# Patient Record
Sex: Female | Born: 1970 | Race: White | Hispanic: No | Marital: Married | Smoking: Former smoker
Health system: Southern US, Community
[De-identification: ages and names within clinical notes are randomized; demographics above are authoritative.]

## PROBLEM LIST (undated history)

## (undated) DIAGNOSIS — D689 Coagulation defect, unspecified: Secondary | ICD-10-CM

## (undated) DIAGNOSIS — N83209 Unspecified ovarian cyst, unspecified side: Secondary | ICD-10-CM

## (undated) DIAGNOSIS — E119 Type 2 diabetes mellitus without complications: Secondary | ICD-10-CM

## (undated) DIAGNOSIS — F32A Depression, unspecified: Secondary | ICD-10-CM

## (undated) DIAGNOSIS — F329 Major depressive disorder, single episode, unspecified: Secondary | ICD-10-CM

## (undated) DIAGNOSIS — Z9049 Acquired absence of other specified parts of digestive tract: Secondary | ICD-10-CM

## (undated) DIAGNOSIS — D759 Disease of blood and blood-forming organs, unspecified: Secondary | ICD-10-CM

## (undated) DIAGNOSIS — F419 Anxiety disorder, unspecified: Secondary | ICD-10-CM

## (undated) HISTORY — PX: OTHER SURGICAL HISTORY: SHX169

## (undated) HISTORY — PX: TONSILLECTOMY: SUR1361

## (undated) HISTORY — PX: ABDOMINAL HYSTERECTOMY: SHX81

## (undated) HISTORY — PX: CHOLECYSTECTOMY: SHX55

---

## 2006-03-10 ENCOUNTER — Other Ambulatory Visit (HOSPITAL_COMMUNITY): Admission: RE | Admit: 2006-03-10 | Discharge: 2006-06-08 | Payer: Self-pay | Admitting: Psychiatry

## 2006-03-10 ENCOUNTER — Ambulatory Visit: Payer: Self-pay | Admitting: Psychiatry

## 2006-03-25 ENCOUNTER — Ambulatory Visit: Payer: Self-pay | Admitting: Unknown Physician Specialty

## 2006-04-12 ENCOUNTER — Ambulatory Visit: Payer: Self-pay | Admitting: Unknown Physician Specialty

## 2006-05-13 ENCOUNTER — Ambulatory Visit: Payer: Self-pay | Admitting: Unknown Physician Specialty

## 2010-07-27 ENCOUNTER — Inpatient Hospital Stay (HOSPITAL_COMMUNITY)
Admission: EM | Admit: 2010-07-27 | Discharge: 2010-08-03 | DRG: 871 | Disposition: A | Discharge status: Home / Self Care | Payer: PRIVATE HEALTH INSURANCE | Attending: Internal Medicine | Admitting: Internal Medicine

## 2010-07-27 ENCOUNTER — Encounter (HOSPITAL_COMMUNITY): Payer: Self-pay | Admitting: Radiology

## 2010-07-27 ENCOUNTER — Emergency Department (HOSPITAL_COMMUNITY): Payer: PRIVATE HEALTH INSURANCE

## 2010-07-27 DIAGNOSIS — R6521 Severe sepsis with septic shock: Secondary | ICD-10-CM | POA: Diagnosis not present

## 2010-07-27 DIAGNOSIS — E872 Acidosis, unspecified: Secondary | ICD-10-CM | POA: Diagnosis present

## 2010-07-27 DIAGNOSIS — R195 Other fecal abnormalities: Secondary | ICD-10-CM | POA: Diagnosis not present

## 2010-07-27 DIAGNOSIS — J9600 Acute respiratory failure, unspecified whether with hypoxia or hypercapnia: Secondary | ICD-10-CM | POA: Diagnosis not present

## 2010-07-27 DIAGNOSIS — R9431 Abnormal electrocardiogram [ECG] [EKG]: Secondary | ICD-10-CM

## 2010-07-27 DIAGNOSIS — K859 Acute pancreatitis without necrosis or infection, unspecified: Secondary | ICD-10-CM | POA: Diagnosis present

## 2010-07-27 DIAGNOSIS — N179 Acute kidney failure, unspecified: Secondary | ICD-10-CM | POA: Diagnosis present

## 2010-07-27 DIAGNOSIS — A419 Sepsis, unspecified organism: Principal | ICD-10-CM | POA: Diagnosis present

## 2010-07-27 DIAGNOSIS — D696 Thrombocytopenia, unspecified: Secondary | ICD-10-CM | POA: Diagnosis present

## 2010-07-27 DIAGNOSIS — A088 Other specified intestinal infections: Secondary | ICD-10-CM | POA: Diagnosis present

## 2010-07-27 DIAGNOSIS — D649 Anemia, unspecified: Secondary | ICD-10-CM | POA: Diagnosis present

## 2010-07-27 DIAGNOSIS — R079 Chest pain, unspecified: Secondary | ICD-10-CM

## 2010-07-27 DIAGNOSIS — D65 Disseminated intravascular coagulation [defibrination syndrome]: Secondary | ICD-10-CM | POA: Diagnosis not present

## 2010-07-27 DIAGNOSIS — K5289 Other specified noninfective gastroenteritis and colitis: Secondary | ICD-10-CM | POA: Diagnosis present

## 2010-07-27 DIAGNOSIS — R6520 Severe sepsis without septic shock: Secondary | ICD-10-CM | POA: Diagnosis present

## 2010-07-27 DIAGNOSIS — F17200 Nicotine dependence, unspecified, uncomplicated: Secondary | ICD-10-CM | POA: Diagnosis present

## 2010-07-27 LAB — COMPREHENSIVE METABOLIC PANEL
Alkaline Phosphatase: 116 U/L (ref 39–117)
BUN: 9 mg/dL (ref 6–23)
Chloride: 106 mEq/L (ref 96–112)
Glucose, Bld: 118 mg/dL — ABNORMAL HIGH (ref 70–99)
Potassium: 3.4 mEq/L — ABNORMAL LOW (ref 3.5–5.1)
Total Bilirubin: 1.3 mg/dL — ABNORMAL HIGH (ref 0.3–1.2)

## 2010-07-27 LAB — POCT I-STAT 3, ART BLOOD GAS (G3+)
Bicarbonate: 17.5 mEq/L — ABNORMAL LOW (ref 20.0–24.0)
Patient temperature: 98.6
pH, Arterial: 7.363 (ref 7.350–7.400)
pO2, Arterial: 69 mmHg — ABNORMAL LOW (ref 80.0–100.0)

## 2010-07-27 LAB — POCT I-STAT, CHEM 8
Glucose, Bld: 115 mg/dL — ABNORMAL HIGH (ref 70–99)
HCT: 51 % — ABNORMAL HIGH (ref 36.0–46.0)
Hemoglobin: 17.3 g/dL — ABNORMAL HIGH (ref 12.0–15.0)
Potassium: 3.2 mEq/L — ABNORMAL LOW (ref 3.5–5.1)
Sodium: 138 mEq/L (ref 135–145)

## 2010-07-27 LAB — CBC
HCT: 45.3 % (ref 36.0–46.0)
MCV: 84.2 fL (ref 78.0–100.0)
RBC: 5.38 MIL/uL — ABNORMAL HIGH (ref 3.87–5.11)
RDW: 12.7 % (ref 11.5–15.5)
WBC: 5.2 10*3/uL (ref 4.0–10.5)

## 2010-07-27 LAB — URINALYSIS, ROUTINE W REFLEX MICROSCOPIC
Hgb urine dipstick: NEGATIVE
Protein, ur: 100 mg/dL — AB
Urobilinogen, UA: 1 mg/dL (ref 0.0–1.0)

## 2010-07-27 LAB — APTT: aPTT: 36 seconds (ref 24–37)

## 2010-07-27 LAB — DIFFERENTIAL
Eosinophils Relative: 1 % (ref 0–5)
Lymphocytes Relative: 8 % — ABNORMAL LOW (ref 12–46)
Lymphs Abs: 0.4 10*3/uL — ABNORMAL LOW (ref 0.7–4.0)
Monocytes Relative: 0 % — ABNORMAL LOW (ref 3–12)

## 2010-07-27 LAB — URINE MICROSCOPIC-ADD ON

## 2010-07-27 LAB — POCT CARDIAC MARKERS: CKMB, poc: 1.1 ng/mL (ref 1.0–8.0)

## 2010-07-27 LAB — LIPASE, BLOOD: Lipase: 31 U/L (ref 11–59)

## 2010-07-27 LAB — LACTIC ACID, PLASMA: Lactic Acid, Venous: 9.1 mmol/L — ABNORMAL HIGH (ref 0.5–2.2)

## 2010-07-27 MED ORDER — IOHEXOL 300 MG/ML  SOLN
80.0000 mL | Freq: Once | INTRAMUSCULAR | Status: AC | PRN
  Administered 2010-07-27: 80 mL via INTRAVENOUS

## 2010-07-28 ENCOUNTER — Inpatient Hospital Stay (HOSPITAL_COMMUNITY): Payer: PRIVATE HEALTH INSURANCE

## 2010-07-28 DIAGNOSIS — D696 Thrombocytopenia, unspecified: Secondary | ICD-10-CM

## 2010-07-28 DIAGNOSIS — A419 Sepsis, unspecified organism: Secondary | ICD-10-CM

## 2010-07-28 DIAGNOSIS — R079 Chest pain, unspecified: Secondary | ICD-10-CM

## 2010-07-28 DIAGNOSIS — N170 Acute kidney failure with tubular necrosis: Secondary | ICD-10-CM

## 2010-07-28 DIAGNOSIS — R6521 Severe sepsis with septic shock: Secondary | ICD-10-CM

## 2010-07-28 DIAGNOSIS — R112 Nausea with vomiting, unspecified: Secondary | ICD-10-CM

## 2010-07-28 DIAGNOSIS — R0789 Other chest pain: Secondary | ICD-10-CM

## 2010-07-28 DIAGNOSIS — R072 Precordial pain: Secondary | ICD-10-CM

## 2010-07-28 DIAGNOSIS — E872 Acidosis: Secondary | ICD-10-CM

## 2010-07-28 DIAGNOSIS — R6520 Severe sepsis without septic shock: Secondary | ICD-10-CM

## 2010-07-28 LAB — CBC
HCT: 34.4 % — ABNORMAL LOW (ref 36.0–46.0)
HCT: 32.4 % — ABNORMAL LOW (ref 36.0–46.0)
Hemoglobin: 13.1 g/dL (ref 12.0–15.0)
Hemoglobin: 12.5 g/dL (ref 12.0–15.0)
Hemoglobin: 11.6 g/dL — ABNORMAL LOW (ref 12.0–15.0)
MCH: 29.3 pg (ref 26.0–34.0)
MCH: 29 pg (ref 26.0–34.0)
MCHC: 36 g/dL (ref 30.0–36.0)
MCV: 81.4 fL (ref 78.0–100.0)
MCV: 81 fL (ref 78.0–100.0)
Platelets: 78 10*3/uL — ABNORMAL LOW (ref 150–400)
RBC: 4.47 MIL/uL (ref 3.87–5.11)
RBC: 4.23 MIL/uL (ref 3.87–5.11)
RBC: 4 MIL/uL (ref 3.87–5.11)

## 2010-07-28 LAB — DRUGS OF ABUSE SCREEN W/O ALC, ROUTINE URINE
Amphetamine Screen, Ur: NEGATIVE
Barbiturate Quant, Ur: NEGATIVE
Marijuana Metabolite: NEGATIVE
Propoxyphene: NEGATIVE

## 2010-07-28 LAB — DIFFERENTIAL
Basophils Relative: 0 % (ref 0–1)
Eosinophils Absolute: 0 10*3/uL (ref 0.0–0.7)
Eosinophils Relative: 0 % (ref 0–5)
Lymphocytes Relative: 3 % — ABNORMAL LOW (ref 12–46)
Monocytes Relative: 3 % (ref 3–12)
Neutrophils Relative %: 94 % — ABNORMAL HIGH (ref 43–77)

## 2010-07-28 LAB — POCT I-STAT 3, ART BLOOD GAS (G3+)
Bicarbonate: 12.4 mEq/L — ABNORMAL LOW (ref 20.0–24.0)
Patient temperature: 101.1
pH, Arterial: 7.381 (ref 7.350–7.400)
pO2, Arterial: 111 mmHg — ABNORMAL HIGH (ref 80.0–100.0)

## 2010-07-28 LAB — COMPREHENSIVE METABOLIC PANEL
ALT: 90 U/L — ABNORMAL HIGH (ref 0–35)
ALT: 51 U/L — ABNORMAL HIGH (ref 0–35)
AST: 175 U/L — ABNORMAL HIGH (ref 0–37)
AST: 59 U/L — ABNORMAL HIGH (ref 0–37)
Albumin: 3 g/dL — ABNORMAL LOW (ref 3.5–5.2)
Alkaline Phosphatase: 61 U/L (ref 39–117)
Alkaline Phosphatase: 39 U/L (ref 39–117)
CO2: 16 mEq/L — ABNORMAL LOW (ref 19–32)
Chloride: 116 mEq/L — ABNORMAL HIGH (ref 96–112)
GFR calc Af Amer: 51 mL/min — ABNORMAL LOW (ref >60)
GFR calc non Af Amer: >60 mL/min (ref >60)
Glucose, Bld: 85 mg/dL (ref 70–99)
Glucose, Bld: 63 mg/dL — ABNORMAL LOW (ref 70–99)
Potassium: 4 mEq/L (ref 3.5–5.1)
Potassium: 3 mEq/L — ABNORMAL LOW (ref 3.5–5.1)
Sodium: 132 mEq/L — ABNORMAL LOW (ref 135–145)
Sodium: 136 mEq/L (ref 135–145)
Total Bilirubin: 0.9 mg/dL (ref 0.3–1.2)
Total Protein: 5.3 g/dL — ABNORMAL LOW (ref 6.0–8.3)

## 2010-07-28 LAB — URINE CULTURE
Culture  Setup Time: 201203162219
Culture: NO GROWTH

## 2010-07-28 LAB — ABO/RH: ABO/RH(D): A POS

## 2010-07-28 LAB — TYPE AND SCREEN: ABO/RH(D): A POS

## 2010-07-28 LAB — HEPATITIS PANEL, ACUTE
HCV Ab: NEGATIVE
Hep A IgM: NEGATIVE

## 2010-07-28 LAB — TSH: TSH: 1.22 u[IU]/mL (ref 0.350–4.500)

## 2010-07-28 LAB — AMYLASE: Amylase: 50 U/L (ref 0–105)

## 2010-07-28 LAB — MRSA PCR SCREENING: MRSA by PCR: NEGATIVE

## 2010-07-28 LAB — CARDIAC PANEL(CRET KIN+CKTOT+MB+TROPI)
CK, MB: 2.9 ng/mL (ref 0.3–4.0)
Relative Index: 1.7 (ref 0.0–2.5)
Total CK: 167 U/L (ref 7–177)
Troponin I: 0.03 ng/mL (ref 0.00–0.06)
Troponin I: 0.03 ng/mL (ref 0.00–0.06)

## 2010-07-28 LAB — CARBOXYHEMOGLOBIN
Carboxyhemoglobin: 0.8 % (ref 0.5–1.5)
Methemoglobin: 0.5 % (ref 0.0–1.5)

## 2010-07-28 LAB — SALICYLATE LEVEL
Salicylate Lvl: <4 mg/dL (ref 2.8–20.0)
Salicylate Lvl: <4 mg/dL (ref 2.8–20.0)

## 2010-07-28 LAB — CK TOTAL AND CKMB (NOT AT ARMC): CK, MB: 2.3 ng/mL (ref 0.3–4.0)

## 2010-07-28 LAB — MAGNESIUM: Magnesium: 1.2 mg/dL — ABNORMAL LOW (ref 1.5–2.5)

## 2010-07-28 LAB — PROTIME-INR: Prothrombin Time: 23.9 seconds — ABNORMAL HIGH (ref 11.6–15.2)

## 2010-07-28 LAB — LACTIC ACID, PLASMA: Lactic Acid, Venous: 7 mmol/L — ABNORMAL HIGH (ref 0.5–2.2)

## 2010-07-28 LAB — CLOSTRIDIUM DIFFICILE BY PCR: Toxigenic C. Difficile by PCR: NEGATIVE

## 2010-07-28 MED ORDER — IOHEXOL 300 MG/ML  SOLN
100.0000 mL | Freq: Once | INTRAMUSCULAR | Status: AC | PRN
  Administered 2010-07-28: 100 mL via INTRAVENOUS

## 2010-07-29 DIAGNOSIS — A419 Sepsis, unspecified organism: Secondary | ICD-10-CM

## 2010-07-29 DIAGNOSIS — R197 Diarrhea, unspecified: Secondary | ICD-10-CM

## 2010-07-29 DIAGNOSIS — N179 Acute kidney failure, unspecified: Secondary | ICD-10-CM

## 2010-07-29 DIAGNOSIS — K5289 Other specified noninfective gastroenteritis and colitis: Secondary | ICD-10-CM

## 2010-07-29 LAB — DIC (DISSEMINATED INTRAVASCULAR COAGULATION)PANEL
Platelets: 68 10*3/uL — ABNORMAL LOW (ref 150–400)
Prothrombin Time: 20.9 seconds — ABNORMAL HIGH (ref 11.6–15.2)
Prothrombin Time: 17 seconds — ABNORMAL HIGH (ref 11.6–15.2)
Smear Review: NONE SEEN
Smear Review: NONE SEEN

## 2010-07-29 LAB — COMPREHENSIVE METABOLIC PANEL
ALT: 46 U/L — ABNORMAL HIGH (ref 0–35)
ALT: 43 U/L — ABNORMAL HIGH (ref 0–35)
Albumin: 2.2 g/dL — ABNORMAL LOW (ref 3.5–5.2)
Alkaline Phosphatase: 45 U/L (ref 39–117)
BUN: 8 mg/dL (ref 6–23)
CO2: 23 mEq/L (ref 19–32)
Calcium: 5.9 mg/dL — CL (ref 8.4–10.5)
Calcium: 6.3 mg/dL — CL (ref 8.4–10.5)
GFR calc Af Amer: >60 mL/min (ref >60)
GFR calc non Af Amer: >60 mL/min (ref >60)
Glucose, Bld: 73 mg/dL (ref 70–99)
Glucose, Bld: 92 mg/dL (ref 70–99)
Potassium: 3 mEq/L — ABNORMAL LOW (ref 3.5–5.1)
Sodium: 140 mEq/L (ref 135–145)
Sodium: 134 mEq/L — ABNORMAL LOW (ref 135–145)
Total Protein: 4 g/dL — ABNORMAL LOW (ref 6.0–8.3)

## 2010-07-29 LAB — POCT I-STAT, CHEM 8
BUN: 11 mg/dL (ref 6–23)
Calcium, Ion: 0.93 mmol/L — ABNORMAL LOW (ref 1.12–1.32)
Chloride: 114 mEq/L — ABNORMAL HIGH (ref 96–112)
HCT: 30 % — ABNORMAL LOW (ref 36.0–46.0)
Sodium: 140 mEq/L (ref 135–145)
TCO2: 14 mmol/L (ref 0–100)

## 2010-07-29 LAB — CBC
HCT: 31.6 % — ABNORMAL LOW (ref 36.0–46.0)
MCH: 28.9 pg (ref 26.0–34.0)
MCHC: 35.4 g/dL (ref 30.0–36.0)
MCV: 81.4 fL (ref 78.0–100.0)
Platelets: 68 10*3/uL — ABNORMAL LOW (ref 150–400)
RDW: 13.3 % (ref 11.5–15.5)
WBC: 19.7 10*3/uL — ABNORMAL HIGH (ref 4.0–10.5)

## 2010-07-29 LAB — POCT I-STAT 3, ART BLOOD GAS (G3+)
Patient temperature: 99.5
TCO2: 16 mmol/L (ref 0–100)
pCO2 arterial: 27.6 mmHg — ABNORMAL LOW (ref 35.0–45.0)
pH, Arterial: 7.354 (ref 7.350–7.400)

## 2010-07-29 LAB — CARBOXYHEMOGLOBIN
Carboxyhemoglobin: 0.8 % (ref 0.5–1.5)
Carboxyhemoglobin: 0.8 % (ref 0.5–1.5)
Carboxyhemoglobin: 1.2 % (ref 0.5–1.5)
Methemoglobin: 0.6 % (ref 0.0–1.5)
O2 Saturation: 84 %
O2 Saturation: 84.7 %
Total hemoglobin: 10.9 g/dL — ABNORMAL LOW (ref 12.5–16.0)
Total hemoglobin: 11.3 g/dL — ABNORMAL LOW (ref 12.5–16.0)

## 2010-07-29 LAB — TECHNOLOGIST SMEAR REVIEW: Path Review: INCREASED

## 2010-07-29 LAB — MAGNESIUM
Magnesium: 1.7 mg/dL (ref 1.5–2.5)
Magnesium: 3 mg/dL — ABNORMAL HIGH (ref 1.5–2.5)

## 2010-07-29 LAB — CALCIUM, IONIZED: Calcium, Ion: 0.94 mmol/L — ABNORMAL LOW (ref 1.12–1.32)

## 2010-07-29 LAB — LACTIC ACID, PLASMA
Lactic Acid, Venous: 1.8 mmol/L (ref 0.5–2.2)
Lactic Acid, Venous: 0.9 mmol/L (ref 0.5–2.2)

## 2010-07-29 NOTE — Consult Note (Signed)
NAMEJAZALYNN, [name]                  ACCOUNT NO.:  [id number]  MEDICAL RECORD NO.:  [id number]           PATIENT TYPE:  E  LOCATION:  [facility]                         FACILITY:  [facility]  PHYSICIAN:  Wendi Snipes, MD DATE OF BIRTH:  12-17-1970  DATE OF CONSULTATION:  07/27/2010 DATE OF DISCHARGE:                                CONSULTATION   REQUESTING PHYSICIAN:  Eduard Clos, MD  PRIMARY CARE DOCTOR:  [facility].  CHIEF COMPLAINT:  Chest pain, nausea, and vomiting.  REASON FOR CONSULTATION:  Chest pain, abnormal EKG.  HISTORY OF PRESENT ILLNESS:  This is 40 year old who presents with nausea and vomiting since Tuesday.  She states that multiple family members have been ill with gastroenteritis since then and report the same symptoms.  However, she reported to the emergency department after she experienced severe chest pain which began this afternoon.  She describes these symptoms with chest pressure and its worse with deep expiration.  The pain is 10/10 in magnitude with deep breath and it does not improve with sitting up.  Overall, the pain has been constant since earlier on today and has not improved much.  She additionally reports lower great fever and decreased urine output in context of not being able to keep anything by mouth down.  She otherwise denies any recent exertional angina, dyspnea on exertion, palpitations, increased lower extremity edema, paroxysmal nocturnal dyspnea, or orthopnea.  ALLERGIES:  No known drug allergies.  MEDICATIONS:  Effexor.  PAST MEDICAL HISTORY:  Hot flashes  SOCIAL HISTORY:  She lives with her husband.  She works at [employer].  She smokes approximately one pack per 2 weeks.  FAMILY HISTORY:  Her father had myocardial infarction at age 50.  REVIEW OF SYSTEMS:  All 14-point systems were reviewed and were negative except those mentioned in detail in the HPI.  PHYSICAL EXAMINATION:  VITAL SIGNS:  Blood pressure is  112/68, respiratory rate 16, her pulse is 198, and she is satting 100% on room air. GENERAL:  She is a 40 year old white female in mild acute distress. HEENT:  Dry mucous membranes. NECK:  No jugular venous distention.  No thyromegaly. CARDIOVASCULAR:  Tachycardic.  No murmurs, rubs or gallops. LUNGS:  Clear to auscultation bilaterally. ABDOMEN:  She had right lower quadrant abdominal ecchymosis.  Abdomen is nontender and nondistended.  Positive bowel sounds.  No mass. EXTREMITIES:  No clubbing, cyanosis, or edema. NEUROLOGIC:  Alert and oriented x3.  Cranial nerves II through XII are grossly intact.  No focal neurologic deficit.  RADIOLOGY:  CTA showed no acute process, though there is a 7-mm nodule which was recommended to follow up in 6 months with CT. EKG showed sinus tachycardia with a rate of 113 beats per minute with diffuse T-wave inversions in inferior ST depressions.  LABORATORY REVIEW:  White blood cells 5.2, hematocrit is 45.3, platelets 131, potassium is 3.4, creatinine is 1.68.  ABG 7.36/30.7/70, lactate 9.1.  myoglobin is 289.  Troponin is less than 0.05.  ASSESSMENT AND PLAN:  This is a 40 year old white female with family history of coronary disease, here with viral  illness associated with pleuritic chest pain and abnormal EKG.  Chest pain, this appears to be some combination of pleural and pericardial inflammation.  She does have negative biomarkers in context of constant ongoing pain for hours though her EKG shows ST depressions and no PR deviation.  These EKG findings appear to be diffuse and overall nonischemic but was concerning nonetheless.  I suggest supportive care with IV fluids and NSAIDS when her renal function improves.  Please recheck her EKG when her heart rate improves.  We will follow closely.     Wendi Snipes, MD     BHH/MEDQ  D:  07/28/2010  T:  07/28/2010  Job:  [id number]  Electronically Signed by Jim Desanctis MD on 07/29/2010  03:25:45 PM

## 2010-07-30 ENCOUNTER — Inpatient Hospital Stay (HOSPITAL_COMMUNITY): Payer: PRIVATE HEALTH INSURANCE

## 2010-07-30 DIAGNOSIS — J9600 Acute respiratory failure, unspecified whether with hypoxia or hypercapnia: Secondary | ICD-10-CM

## 2010-07-30 DIAGNOSIS — R933 Abnormal findings on diagnostic imaging of other parts of digestive tract: Secondary | ICD-10-CM

## 2010-07-30 LAB — BLOOD GAS, ARTERIAL
Drawn by: 33176
MECHVT: 500 mL
RATE: 15 resp/min
pCO2 arterial: 39.7 mmHg (ref 35.0–45.0)
pH, Arterial: 7.339 — ABNORMAL LOW (ref 7.350–7.400)

## 2010-07-30 LAB — GLUCOSE, CAPILLARY: Glucose-Capillary: 82 mg/dL (ref 70–99)

## 2010-07-30 LAB — CBC
Platelets: 64 10*3/uL — ABNORMAL LOW (ref 150–400)
RDW: 13.8 % (ref 11.5–15.5)
WBC: 13 10*3/uL — ABNORMAL HIGH (ref 4.0–10.5)

## 2010-07-30 LAB — MAGNESIUM: Magnesium: 1.9 mg/dL (ref 1.5–2.5)

## 2010-07-30 LAB — BASIC METABOLIC PANEL
BUN: 5 mg/dL — ABNORMAL LOW (ref 6–23)
GFR calc non Af Amer: >60 mL/min (ref >60)
Potassium: 3.1 mEq/L — ABNORMAL LOW (ref 3.5–5.1)
Sodium: 132 mEq/L — ABNORMAL LOW (ref 135–145)

## 2010-07-30 NOTE — Consult Note (Signed)
[name], [name]                  ACCOUNT NO.:  [id number]  MEDICAL RECORD NO.:  [id number]           PATIENT TYPE:  LOCATION:                                 FACILITY:  PHYSICIAN:  Sheliah Plane, MD    DATE OF BIRTH:  04/06/1971  DATE OF CONSULTATION: DATE OF DISCHARGE:                                CONSULTATION   REASON FOR CONSULTATION:  Sepsis, question of esophageal perforation.  HISTORY OF PRESENT ILLNESS:  The patient is a 40 year old female who has had 4-5 days of nausea, vomiting, and diarrhea along with several other members of her family.  She noted it had gradually improved by yesterday morning, but then yesterday afternoon about 24 hours ago she developed epigastric pain radiating into her back and then into her shoulders and felt poorly.  She was brought by EMS to the emergency room with the question of myocardial infarction.  She had refused to take any aspirin. They did give her nitroglycerin.  She was hypotensive in the 80s and hasremained that way in spite of fluid resuscitation.  On initial presentation, her lipase was normal.  Bilirubin was 1.3, since it has risen to 3.8.  SGOT and SGPT are elevated.  Cardiac enzymes were cycled and showed no evidence of ischemia.  Cardiology had seen the patient last night.  During the day today, she has continued to be hypotensive and Critical Care Medicine was consulted and Dr. Molli Knock with her history of vomiting was concerned of possible esophageal perforation.  PAST MEDICAL HISTORY:  Significant for previous cholecystectomy and previous hysterectomy.  No other previous surgery.  FAMILY HISTORY:  The patient has 3 children, 16, 13, 10, all healthy. One has been sick.  Both her mother and father had similar GI symptoms.  REVIEW OF SYSTEMS:  Until recently, she denies any constitutional symptoms other than as noted above.  Of note, she has noted over the past 2-3 weeks increasing episodes of bruising.  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  Since then, she has noted several bruises on her arms and right hip.  On admission, now her platelet count is 131,000, but has dropped to 75,000.  PHYSICAL EXAMINATION:  The patient is a thin white female.  Blood pressures is 102/54, heart rate is 93, and O2 sats 100.  She is writhing in bed and difficult to get comfortable.  She has no carotid bruits. Cardiac exam reveals regular rate and rhythm with tachycardia, but no murmur.  On abdominal exam, the lower abdomen is without rebound or tenderness.  Periumbilical and epigastric midline pain is where she is most tender and on exam she has hypoactive bowel sounds.  She has no calf tenderness.  Pulse shows palpable femoral pulses.  Her laboratory findings are reviewed including CT scan done last night and chest x-ray done just an hour ago after placement of central venous line.  Since admission, her white count has gone from 5000-26,000 with a large left shift.  Platelet count has gone from 131  down to 75. Bilirubin has gone from 1.3 to 3.8.  There is one lipase recorded that was normal.  There is no amylase level drawn.  PT and PTT are normal.  IMPRESSION:  Although the patient does have history of nausea, vomiting, and back pain, reviewing the CT scan and chest x-ray I have a low likelihood to think that she has a perforated esophagus.  There is no mediastinal air.  There is no effusion and now 24 hours after onset of pain we would suspect that the chest x-ray would have more findings.  On physical exam, she appears to more have exam consistent with pancreatitis, though her lipase is not elevated.  I have discussed the case with Dr. Molli Knock and also Dr. Bertram Savin of General Surgery and suggest that we proceed with CT scan of the abdomen which was not done, with the chest last night that was done primarily to rule  out pulmonary embolus.  This way we can both look at the abdomen, area of the pancreas and also see at least the lower portion of the esophagus.  We will also repeat amylase, lipase, bilirubin, and liver enzymes.  Dr. Freida Busman of General Surgery will also come in and examine the patient.  At this point, it appears to be most likely intraabdominal process.     Sheliah Plane, MD     EG/MEDQ  D:  07/28/2010  T:  07/29/2010  Job:  [id number]  Electronically Signed by Sheliah Plane MD on 07/30/2010 09:10:10 AM

## 2010-07-31 ENCOUNTER — Inpatient Hospital Stay (HOSPITAL_COMMUNITY): Payer: PRIVATE HEALTH INSURANCE

## 2010-07-31 DIAGNOSIS — J9600 Acute respiratory failure, unspecified whether with hypoxia or hypercapnia: Secondary | ICD-10-CM

## 2010-07-31 DIAGNOSIS — K529 Noninfective gastroenteritis and colitis, unspecified: Secondary | ICD-10-CM

## 2010-07-31 DIAGNOSIS — R6520 Severe sepsis without septic shock: Secondary | ICD-10-CM

## 2010-07-31 DIAGNOSIS — N170 Acute kidney failure with tubular necrosis: Secondary | ICD-10-CM

## 2010-07-31 DIAGNOSIS — R6521 Severe sepsis with septic shock: Secondary | ICD-10-CM

## 2010-07-31 DIAGNOSIS — R933 Abnormal findings on diagnostic imaging of other parts of digestive tract: Secondary | ICD-10-CM

## 2010-07-31 DIAGNOSIS — A419 Sepsis, unspecified organism: Secondary | ICD-10-CM

## 2010-07-31 DIAGNOSIS — R197 Diarrhea, unspecified: Secondary | ICD-10-CM

## 2010-07-31 LAB — COMPREHENSIVE METABOLIC PANEL
AST: 24 U/L (ref 0–37)
BUN: 3 mg/dL — ABNORMAL LOW (ref 6–23)
CO2: 26 mEq/L (ref 19–32)
Calcium: 7.8 mg/dL — ABNORMAL LOW (ref 8.4–10.5)
Creatinine, Ser: 0.7 mg/dL (ref 0.4–1.2)
GFR calc Af Amer: >60 mL/min (ref >60)
GFR calc non Af Amer: >60 mL/min (ref >60)

## 2010-07-31 LAB — CBC
Hemoglobin: 10.9 g/dL — ABNORMAL LOW (ref 12.0–15.0)
MCH: 29.5 pg (ref 26.0–34.0)
MCHC: 35.9 g/dL (ref 30.0–36.0)
MCV: 82.2 fL (ref 78.0–100.0)

## 2010-07-31 LAB — PROTIME-INR
INR: 1.13 (ref 0.00–1.49)
Prothrombin Time: 14.7 seconds (ref 11.6–15.2)

## 2010-07-31 NOTE — Consult Note (Signed)
[name], [name]                  ACCOUNT NO.:  [id number]  MEDICAL RECORD NO.:  [id number]           PATIENT TYPE:  I  LOCATION:  2911                         FACILITY:  [facility]  PHYSICIAN:  Lennie Muckle, MD      DATE OF BIRTH:  04-14-1971  DATE OF CONSULTATION:  07/28/2010 DATE OF DISCHARGE:                                CONSULTATION   REQUESTING PHYSICIAN:  Sheliah Plane, MD  REASON FOR CONSULTATION:  Question pancreatitis.  [name] is a 40 year old female who was in her usual state of health, a week ago.  She states that she attended a church function this past week.  Several of her family members developed a viral syndrome with nausea, vomiting and diarrhea.  She states she also developed this on Tuesday.  She feels somewhat tired but okay.  On Friday, she had chest discomfort.  This seemed to resolve, but then worsened acutely at the end of the day.  She described as a pressure of severe pain retrosternally.  She had family history of coronary artery disease, and she was concerned about cardiac event.  She came to the emergency department for a workup.  A cardiac workup was negative.  A CT of the chest ruled out a PE and did not really have anything acutely evident on the scan.  Due to her recurrent episodes of nausea and vomiting, it is a question whether or not she had Boerhaave syndrome.  Thus, the call was made to Dr. Tyrone Sage.  The patient was seen and evaluated, did not feel as if there was Boerhaave syndrome.  She states that in the past few weeks, she has noted bruising on her hands and abdomen.  She has not had any illnesses previously.  No bites.  She has had some elevation of liver enzymes and other signs consistent with sepsis.  Her white count is elevated to 23, hemoglobin 12, platelets of 75.  Her hepatitis panel is negative.  Her CMP revealed BUN and creatinine of 12 and 1.38. Bilirubin is elevated at 3.8, alkaline phosphatase 68, AST 175, ALT of 90,  total protein was 5.  Lactate was elevated at 7.  Chest x-ray did not really show any significant abnormalities.  KUB showed no pneumatosis or dilation of the intestine.  PAST MEDICAL HISTORY:  She had a cholecystectomy, 10 years ago.  MEDICATIONS:  Effexor and Ativan.  No allergies.  FAMILY HISTORY:  Coronary artery disease and breast cancer.  SOCIAL HISTORY:  She smokes occasionally.  Occasional alcohol.  Both family members did have cirrhosis.  REVIEW OF SYSTEMS:  Negative other than the HPI.  PHYSICAL EXAMINATION:  GENERAL:  She is lying in bed, appears somewhat anxious. VITAL SIGNS:  Temperature is 101.2, blood pressure is currently 98/51, heart rate is 94, O2 sats 100% on 2 L via nasal cannula.  In's and Out's, she has had approximately 1300 of urine today. HEENT:  Head is normocephalic.  Extraocular muscles are intact.  Pupils are equal and round.  Sclerae and conjunctivae are clear.  Nares are clear without drainage.  Oral mucosa appears somewhat  dry.  Her thyroid is without palpable abnormalities.  The trachea is midline. NECK:  No significant amount of tenderness. CHEST:  Diminished at the bases, otherwise clear.  She does have good respiratory effort. CARDIOVASCULAR:  Regular rate and rhythm.  No murmurs, gallops, or rubs are appreciated.  No edema in lower extremities. ABDOMEN:  Abdomen is nondistended.  She is soft, slightly tender in epigastric region.  Incisional scar at the umbilical region without evidence of hernias.  No organomegaly is appreciated.  No peritoneal signs are elicited. SKIN:  Warm to the touch.  No rashes.  She has some ecchymosis scattered in the abdomen, pelvic, and upper extremities. PSYCHOLOGIC:  She is anxious. MUSCULOSKELETAL:  No deformities.  Normal range of motion.  No pain with palpation of the joints.  ASSESSMENT AND PLAN:  Sepsis with question of pancreatitis, question of viral syndrome.  I do not think given everything that there  is an esophageal perforation.  I would agree with CT scan of the abdomen and pelvis with contrast to fully delineate the GI source.  The patient was seen along with Dr. Charna Elizabeth who agrees with this.  It could be a sepsis from an unknown source.  Could be viral in nature. Dr. Park Breed has also been consulted due to thrombocytopenia.  Will follow up after a CT scan but no acute surgery is needed at the present time.     Lennie Muckle, MD     ALA/MEDQ  D:  07/28/2010  T:  07/29/2010  Job:  [id number]  Electronically Signed by Bertram Savin MD on 07/31/2010 12:12:01 PM

## 2010-08-01 ENCOUNTER — Encounter (HOSPITAL_COMMUNITY): Payer: Self-pay | Admitting: Radiology

## 2010-08-01 ENCOUNTER — Inpatient Hospital Stay (HOSPITAL_COMMUNITY): Payer: PRIVATE HEALTH INSURANCE

## 2010-08-01 DIAGNOSIS — R1013 Epigastric pain: Secondary | ICD-10-CM

## 2010-08-01 DIAGNOSIS — R110 Nausea: Secondary | ICD-10-CM

## 2010-08-01 DIAGNOSIS — R933 Abnormal findings on diagnostic imaging of other parts of digestive tract: Secondary | ICD-10-CM

## 2010-08-01 LAB — AMYLASE: Amylase: 66 U/L (ref 0–105)

## 2010-08-01 LAB — BASIC METABOLIC PANEL
Chloride: 103 mEq/L (ref 96–112)
GFR calc Af Amer: >60 mL/min (ref >60)
GFR calc non Af Amer: >60 mL/min (ref >60)
Potassium: 3 mEq/L — ABNORMAL LOW (ref 3.5–5.1)
Sodium: 138 mEq/L (ref 135–145)

## 2010-08-01 LAB — CBC
MCV: 82.7 fL (ref 78.0–100.0)
Platelets: 85 10*3/uL — ABNORMAL LOW (ref 150–400)
RBC: 3.98 MIL/uL (ref 3.87–5.11)
WBC: 5.1 10*3/uL (ref 4.0–10.5)

## 2010-08-01 LAB — LIPASE, BLOOD: Lipase: 60 U/L — ABNORMAL HIGH (ref 11–59)

## 2010-08-01 MED ORDER — IOHEXOL 300 MG/ML  SOLN
100.0000 mL | Freq: Once | INTRAMUSCULAR | Status: AC | PRN
  Administered 2010-08-01: 100 mL via INTRAVENOUS

## 2010-08-02 DIAGNOSIS — R933 Abnormal findings on diagnostic imaging of other parts of digestive tract: Secondary | ICD-10-CM

## 2010-08-02 DIAGNOSIS — R1013 Epigastric pain: Secondary | ICD-10-CM

## 2010-08-02 DIAGNOSIS — R110 Nausea: Secondary | ICD-10-CM

## 2010-08-02 LAB — COMPREHENSIVE METABOLIC PANEL
ALT: 23 U/L (ref 0–35)
AST: 27 U/L (ref 0–37)
Alkaline Phosphatase: 53 U/L (ref 39–117)
CO2: 27 mEq/L (ref 19–32)
Chloride: 104 mEq/L (ref 96–112)
GFR calc Af Amer: >60 mL/min (ref >60)
GFR calc non Af Amer: >60 mL/min (ref >60)
Sodium: 136 mEq/L (ref 135–145)
Total Bilirubin: 0.7 mg/dL (ref 0.3–1.2)

## 2010-08-02 LAB — CBC
Hemoglobin: 12.4 g/dL (ref 12.0–15.0)
MCH: 29 pg (ref 26.0–34.0)
MCHC: 35.1 g/dL (ref 30.0–36.0)
Platelets: 112 10*3/uL — ABNORMAL LOW (ref 150–400)
RDW: 12.9 % (ref 11.5–15.5)

## 2010-08-02 NOTE — Consult Note (Signed)
[name], [name]                  ACCOUNT NO.:  [id number]  MEDICAL RECORD NO.:  [id number]           PATIENT TYPE:  I  LOCATION:  2911                         FACILITY:  [facility]  PHYSICIAN:  Drue Second, M.D.     DATE OF BIRTH:  1970-11-20  DATE OF CONSULTATION:  07/28/2010 DATE OF DISCHARGE:                                CONSULTATION   REASON FOR CONSULTATION:  A 40 year old female with thrombocytopenia.  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  The patient began having symptoms of nausea, vomiting, and diarrhea starting on Tuesday.  She tells [place] that lot of her family members are sick as well.  It initially was felt that this was a viral infection.  She continued to manage her symptoms at home.  On Friday, the patient went to her church to pack some chicken barbecue, but she did not consume the barbecue.  While there, the patient developed severe chest pains; and because of that, she was presented to [facility] emergency room.  She had a CT angiogram performed that revealed no evidence of a pulmonary embolism.  In the ER, the patient was found to have severe chest pain with EKG changes.  She was hypotensive, and she also was febrile with persistence of nausea, vomiting, and profuse diarrhea.  The patient, due to her hypotension, was transferred to the Intensive Care Unit.  Her initial CBC in the emergency room was normal with white count of 5.2, hemoglobin 15.9, hematocrit 45.3, and platelets were slightly low at 131,000.  I was requested today to see the patient as her platelets had dropped further in the 70s.  Her hemoglobin has also dropped from 15.9 to 12.  Most recent hemoglobin is 11.9 with a recent platelet count of about 67,000.  PT is elevated as well as PTT that is elevated at 53 seconds.  I have been asked to rule out TTP versus DIC versus consumptive  thrombocytopenia.  PAST MEDICAL HISTORY:  Significant for depression.  ALLERGIES:  No known drug allergies.  SOCIAL HISTORY:  The patient does smoke occasionally.  She drinks occasionally.  She has a history of Dilaudid abuse.  She is married and she has 3 children.  FAMILY HISTORY:  Significant for coronary artery disease, breast cancer, and history of father having had coronary artery bypass graft.  PAST SURGICAL HISTORY:  Significant for hysterectomy done about 9 years ago with unilateral oophorectomy and cholecystectomy.  PHYSICAL EXAMINATION:  GENERAL:  The patient is in severe pain and in obvious distress. VITAL SIGNS:  Temperature of 101.1 and heart rate is 102.  Blood pressures range from 99/50s-103 over 60s,  O2 on room air is 100%. LUNGS: Bilateral crackles with diminished breath sounds in the bases. CARDIOVASCULAR:  Regular rate and rhythm, but tachycardic. Abdomen:  Tender.  It is nondistended, although later on examination. She was found to have bilateral flank prominence.  EXTREMITIES:  No edema. NEURO:  The patient is alert, oriented, and very anxious.  LABORATORY DATA:  Most recent labs with WBC count of 19.6, hemoglobin 11.6, hematocrit 32.4, and platelets are 67,000.  Earlier in the day, the white count was about 23,000 and platelets were in the 75 range. Electrolytes reveal sodium 136, potassium 3.0, chloride 116, bicarb 16, BUN 12 and creatinine 1.0,.  D-dimer is significantly elevated and is greater than 20.  Calcium is severely low at 5.8, albumin 2.1, ALT 51, AST 59, magnesium is 3.8.  Lipase is normal at 25, T bili 0.9, and amylase is 50.  RADIOGRAPHIC STUDIES:  Reviewed.  There is some evidence of possible colitis in the colon and some stranding around the pancreas.  No dilated bile ducts or common bile duct.  There is small pleural effusion and some atelectasis in the chest.  No evidence of pulmonary embolism.  IMPRESSION AND PLAN:  A 41 year old  female with: 1. Probable sepsis, now possibly with perforation due to evolving     examination. The patient also could have sepsis secondary virus     etiology versus bacterial versus primary GI versus pancreatitis.     Her thrombocytopenia is most likely consumptive due to sepsis.     Review of her peripheral smear did not reveal any evidence of     histocytes.  She was left-shifted with increased number polys and     some demilune bodies were noted in her white cells.  Platelet count     was reduced on the smear with some large forms noted.  No evidence     of other abnormalities. 2. Leukocytosis, likely secondary to sepsis.  The patient is currently     on broad-spectrum antibiotics, and I would continue this, and     encourage an ID consultation. 3. Anemia, possibly due to GI losses. The patient can be transfused as     needed. 4. Disseminated intravascular coagulation.  I would monitor D-dimer,     fibrinogen, and PT/PTT.     Drue Second, M.D.     KK/MEDQ  D:  07/28/2010  T:  07/29/2010  Job:  [id number]  Electronically Signed by Drue Second MD on 08/02/2010 05:20:30 PM

## 2010-08-03 DIAGNOSIS — R197 Diarrhea, unspecified: Secondary | ICD-10-CM

## 2010-08-03 DIAGNOSIS — A419 Sepsis, unspecified organism: Secondary | ICD-10-CM

## 2010-08-03 LAB — BASIC METABOLIC PANEL
BUN: 7 mg/dL (ref 6–23)
GFR calc non Af Amer: >60 mL/min (ref >60)
Potassium: 3.6 mEq/L (ref 3.5–5.1)

## 2010-08-03 LAB — CULTURE, BLOOD (ROUTINE X 2): Culture: NO GROWTH

## 2010-08-03 LAB — CBC
MCV: 81.8 fL (ref 78.0–100.0)
Platelets: 175 10*3/uL (ref 150–400)
RDW: 12.8 % (ref 11.5–15.5)
WBC: 5.2 10*3/uL (ref 4.0–10.5)

## 2010-08-04 NOTE — Discharge Summary (Signed)
[name], [name]                  ACCOUNT NO.:  [id number]  MEDICAL RECORD NO.:  [id number]           PATIENT TYPE:  I  LOCATION:  5526                         FACILITY:  [facility]  PHYSICIAN:  Andreas Blower, MD       DATE OF BIRTH:  18-Jul-1970  DATE OF ADMISSION:  07/27/2010 DATE OF DISCHARGE:  08/03/2010                              DISCHARGE SUMMARY   PRIMARY CARE PHYSICIAN:  Dr. Zada Finders, [place].  DISCHARGE DIAGNOSES: 1. Sepsis. 2. Ventilatory-dependent respiratory failure status post intubated     from July 28, 2010, until July 31, 2010. 3. Acute gastroenteritis likely infectious. 4. Colitis. 5. Questionable pancreatitis on imaging resolved. 6. Leukocytosis. 7. Anemia. 8. DIC. 9. Acute renal failure. 10.Elevated liver function tests improved during her course hospital     stay. 11.Chest pain.  DISCHARGE MEDICATIONS: 1. Imodium 2 mg twice daily as needed for diarrhea. 2. Omeprazole 20 mg p.o. daily.  The patient given total of 20 days     prescription.  Dr. Christella Hartigan to reassess whether the patient needs to     be on a PPI for a longer period of time at the office visit. 3. Zofran 8 mg p.o. twice daily schedule for at least 7 days. 4. Compazine 10 mg every 6 hours as needed for nausea. 5. Ativan 0.5 mg daily as needed for sleep. 6. Effexor 75 mg p.o. daily.  BRIEF ADMITTING HISTORY AND PHYSICAL:  [name] is a 40 year old Caucasian female with no significant past medical history except depression who presented with chest pain that she has been having with nausea, vomiting, diarrhea.  CONSULTATIONS OBTAINED DURING THE COURSE OF HER HOSPITAL STAY: 1. Dr. Loreta Ave and Dr. Christella Hartigan from GI evaluated the patient during the     course of her hospital stay. 2. Dr. Marcelle Overlie from Cardiology evaluated the patient.  3. Dr. Tyrone Sage from Thoracic Surgery evaluated the patient during     the course of her hospital stay. 3. Dr. Bertram Savin from Surgery evaluated the patient during  the     course of her hospital stay. 4. Dr. Welton Flakes from Hematology/Oncology evaluated the patient during the     course of her hospital stay. 5. Dr. Orvan Falconer from Infectious Disease has evaluated the patient. 6. Pulmonary Critical Care also evaluated the patient.  RADIOLOGY/IMAGING:  The patient has had multiple imaging list. Prominent imaging:  The patient had CT of the chest with contrast on July 27, 2010, showed no evidence of pulmonary embolism, 7-mm indeterminate nodule noted in the superior segment of the right lower lung.  A repeat CT in about 3 months is recommended. The patient had CT of the chest with contrast on July 28, 2010, which showed some indistinct peripancreatic fat plane extending into anterior perirenal space most likely represents small pancreatitis.  Questionable mucosal edema of the right colon and cecum.  Possible focal pancreatitis.  The patient had a CT of the abdomen and pelvis with contrast on the same day.  The patient had another CT of the abdomen and pelvis with contrast on August 01, 2010, which showed overall improved appearance  of abdomen and pelvis.  Resolution of a previously described peripancreatic edema and probable colitis.  Small volume cul-de-sac fluid noted.  Bibasilar atelectasis and persistent small bilateral pleural effusion noted.  LABORATORY DATA:  CBC shows white count of 5.2, hemoglobin 12.2, hematocrit 35.5, platelet count 175.  Platelet count dropped down to 67 during the course of her hospital stay.  Electrolytes normal with a creatinine of 0.68.  Initial creatinine presentation was 1.6.  Liver function tests were normal except the albumin is 3.0, calcium is 8.3. AST is 175.  Initially on presentation, ALT was 90.  Lipase initially peaked at 60 and dropped down to 46 time at the time of discharge. Troponins were negative x4.  Hepatitis panel was negative.  TSH was 1.22.  Drug screen was negative.  UA was negative for nitrites,  had small leukocytes.  Urine culture showed no growth.  Blood cultures x2 showed no growth.  MRSA by PCR was negative.  Fecal occult was negative. C diff PCR was negative.  HOSPITAL COURSE BY PROBLEM: 1. Sepsis, source unclear maybe due to gastroenteritis, viral versus     bacterial.  Sepsis improved during the course of her hospital stay. 2. Ventilatory-dependent respiratory failure due to sepsis, resolved.     The patient was extubated on July 31, 2010, improved during the     course of her hospital stay. 3. Abdominal pain and gastroenteritis and questionable pancreatitis.     The patient had CT which showed peripancreatic stranding, however,     her lipase was normal.  GI was consulted and did not      perform any procedures as the patient had improved during the     course of her hospital stay.  Initially, there was question about a     possible esophageal tear, as a result General Surgery and Thoracic     Surgery was consulted who upon re-imaging did not see an esophageal     tear.  The patient also by this time had improved during the course     of her hospital stay. Clostridium PCR was negative.  The source     for her nausea/vomiting was uncertain may be due to viral illness.     The viral illness may also have led up to the sepsis. 5. Colitis presumed to be from viral source as bacterial sources were     negative.  Infectious Diseases was also consulted.  Recommended     ceftriaxone and metronidazole for a total of 7 days of     empiric course. 6. Anemia and thrombocytopenia.  During the course of her hospital     stay, the patient had DIC though no schistocytes were seen on     peripheral smear.  The patient may have possible DIC from sepsis     improved during the course of her hospital stay. Hematology     was consulted. 7. Acute renal failure most likely prerenal in etiology from     dehydration improved with IV hydration. 8. Elevated liver function tests likely due to  sepsis,     gastroenteritis, colitis and possible pancreatitis improved during     the course of her hospital stay.  The patient also transiently had     elevated INR to suggest that she had a decline in her synthetic     function.  Her INR was peaked at 1.78, however, in few days     improved to 1.13 to suggest an improvement in her  renal function. 9. Hypotension from sepsis.  At one point required two pressors.  The     patient had been on norepinephrine, vasopressin at one point,     however, was successfully titrated off as her sepsis improved. 10.A 7-mm indeterminate nodule in the superior segment of right lower     lung.  The patient was notified of these findings and was     instructed to follow up with her primary care physician for a     repeat CT in about 6 months.  DISPOSITION AND FOLLOWUP:  The patient was instructed to follow up with her primary care physician, Dr. Zada Finders in [facility] early next week.  The patient was also instructed to follow with Dr. Christella Hartigan with GI in 1-2 weeks for procedures.  The patient was also instructed that she will need a CT scan of her chest in 6 months for evaluation of the pulmonary nodule.  Time spent on discharge talking to the patient and family and coordinating care was 40 minutes.   Andreas Blower, MD   SR/MEDQ  D:  08/03/2010  T:  08/04/2010  Job:  [id number]  Electronically Signed by Wardell Heath Govanni Plemons  on 08/04/2010 06:22:11 PM

## 2010-08-13 NOTE — H&P (Signed)
NAMEMERCADEZ, HEITMAN                  ACCOUNT NO.:  [id number]  MEDICAL RECORD NO.:  [id number]           PATIENT TYPE:  LOCATION:                                 FACILITY:  PHYSICIAN:  Eduard Clos, MDDATE OF BIRTH:  [birth year]  DATE OF ADMISSION: DATE OF DISCHARGE:                             HISTORY & PHYSICAL   PRIMARY CARE PHYSICIAN:  At [facility].  CHIEF COMPLAINT:  Chest pain.  HISTORY OF PRESENT ILLNESS:  A 40 year old female with no significant past medical history, has been experiencing some nausea, vomiting, and diarrhea multiple episodes over the last 4 days, which actually improved yesterday, but by evening she started developing some chest pain, which was retrosternal, radiating to the back and to the neck, sometimes stabbing, sometimes pressure-like, and after that she again started developing nausea, vomiting, and diarrhea, multiple episodes.  The patient came to the ER, was found to be hypotensive.  The patient also has been noticing some bruise in the hands and abdomen over the last 2-3 weeks.  The patient denies any insect bite or going to the forest or outside the country.  In the ER, the patient had a CT chest, which did not rule out any PE and did not show anything acute.  The patient was found to be hypotensive, has been given 3.5 liters normal saline bolus.  At this time, blood pressure is around 90s.  Tachycardia is improved from 120s to 90 now. The patient's EKG does show diffuse ST-T changes with some ST depression in the inferior leads.  I did discuss with on-call cardiologist, Dr. Marcelle Overlie, who at this time feels since the patient has diffuse changes is more of systemic cough rather than ischemic.  In any case, we will be following her cardiac enzyme.  The patient denies any loss of consciousness, does state feel weak and dizzy, did not have any focal deficit.  Specifically, denies any abdominal pain.  Did not have any dysuria or  discharges.  PAST MEDICAL HISTORY:  Nothing significant, except that the patient had bilateral oophorectomy and hysterectomy for endometriosis 10 years ago, and the patient takes Effexor for the same.  The patient also had had cholecystectomy.  MEDICATIONS PRIOR TO ADMISSION:  The patient takes Effexor for her hot flashes and Ativan p.r.n.  ALLERGIES:  No known drug allergies.  FAMILY HISTORY:  Significant for coronary artery disease in her dad, who had undergone bypass, and breast cancer in her mom.  The patient is aware of breast cancer screening.  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  She states she does not abuse any drugs.  REVIEW OF SYSTEMS:  As per history of presenting illness, nothing else significant.  PHYSICAL EXAMINATION:  GENERAL:  The patient examined at the bedside, not in acute distress. VITAL SIGNS:  Blood pressure is 94/57, pulse is 94 per minute, temperature 98.8, respiration 18 per minute, O2 sat 96%. HEENT:  Anicteric.  No pallor.  No facial asymmetry.  Tongue is midline. Nonexudative. CHEST:  Bilateral air entry present.  No rhonchi.  No crepitation. HEART:  S1 and S2 heard. ABDOMEN:  Soft, nontender.  Bowel sounds heard.  There is a bruise in the right flank area which is around 5 cm and 2 cm with some central clearing. CNS:  The patient is alert, awake, oriented to time, place, and person. Moves upper and lower extremities, 5/5. EXTREMITIES:  Peripheral pulses felt.  There is also a bruise in the both upper arms which is around 2-3 cm nonblanching erythematous to purplish color.  There is no edema, ischemic changes, or cyanosis, but when the patient came initially her lips look cyanotic, which has improved at this time.  LABS:  EKG, sinus rhythm with sinus tachycardia, beats around 113 beats per minute  with diffuse ST-T changes, which I did discuss with the cardiologist on call Dr. Marcelle Overlie as well as the ST depression in the inferior leads.  At this time, Dr. Marcelle Overlie feels it is more of a systemic illness going on rather than acute ischemic change.  Chest x- ray shows no active disease.  CT angio chest shows no evidence of pulmonary embolism, 7-mm indeterminate nodule, superior segment, right lower lung.  Follow up CT in 6 months.  Recommended no focal airspace disease or consolidation.  ABG, pH is 7.36, pCO2 is 30.7, pO2 is 69, oxygen saturation 93%.  CBC, WBC is 5.2, hemoglobin is 17.3, hematocrit is 51, platelets 131.  PT/INR 78.1 and 1.3.  Complete metabolic panel: Sodium 138, potassium 3.2, chloride 105, carbon dioxide 17, anion gap is 16, BUN 10, creatinine 1.6, total bilirubin is 1.3, alkaline phos 116, AST 130, ALT 56, total protein 6.8, albumin 3.8, calcium 8.9, lactic acid 9.1, lipase 31, CK-MB is 1.1, troponin-I less than 0.05, myoglobin is 289.  UA shows cloudy appearance, ketones 15, nitrites negative, small leukocytes, few squamous cells, wbc 3-6, few bacteria.  Fecal occult blood is negative.  Cultures are pending.  ASSESSMENT: 1. Hypotension with nausea, vomiting, and diarrhea. 2. Nausea, vomiting, diarrhea, possibly viral etiology. 3. Chest pain with abnormal EKG. We will have to rule out acute     coronary syndrome. 4. Multiple bruises of unknown cause. 5. Abnormal LFTs. 6. Mild thrombocytopenia. 7. Metabolic acidosis with renal failure.  PLAN: 1. At this time, we are going to admit the patient to step-down unit. 2. For hypotension and acute renal failure with metabolic acidosis, we     are going to continue to aggressively hydrate the patient.  The     patient has already received 3.5 liters of normal saline.  We are     going to give another half liter and continue with 103 mL/hour.     The patient remains hypotensive and we may have to give more     boluses.   At this time, the patient is responding to IV fluids, so     at this time we do not considering any vasopressors. 3. We are going to get blood cultures, urine cultures, stool studies.     At this time, I am going to empirically start the patient on Cipro     and Flagyl with a possibility of urinary tract infection.  The     patient's lactic acid is high.  We are going to recheck it in the     morning.  At this time, the patient's abdomen is benign, so I have     not ordered any scan at this  time.  If the lactic acid persists and     the patient's nausea, vomiting, and diarrhea still persists, then     we may consider doing a scan. 4. Abnormal LFTs.  At this time, it may be due to the hypotension.  I     am going to recheck it in the morning.  We will do an acute hepatic     panel and we will also check salicylate and Tylenol level.  The     patient did have history of cholecystectomy.  The patient's abdomen     at this time benign.  As earlier suggested if there is any     worsening of LFTs or if there is any worsening of nausea, vomiting,     diarrhea, we may consider scan. 5. Abnormal EKG with chest pain.  We are going to cycle her cardiac     markers.  I did discuss with Dr. Marcelle Overlie and Dr. Marcelle Overlie said at     this time he feels it is a systemic illness going around, which may     be causing the EKG changes also, but anyway he advised to cycle     cardiac markers.  If anything is turning out positive, then he may     come to do consult.  At this time, we are going to cycle cardiac     markers and also get a 2-D echo. 6. The patient does have mild thrombocytopenia.  We are going to     closely follow that with the morning labs. 7. Bruise in the skin.  We do not know exact cause for this, but we     observing it closely. 8. Further recommendation as condition evolves.     Eduard Clos, MD     ANK/MEDQ  D:  07/28/2010  T:  07/28/2010  Job:  [id number]  Electronically Signed  by Midge Minium MD on 08/13/2010 08:02:28 AM

## 2010-08-21 NOTE — Consult Note (Signed)
[name], EBARB                  ACCOUNT NO.:  [id number]  MEDICAL RECORD NO.:  [id number]           PATIENT TYPE:  I  LOCATION:  2911                         FACILITY:  [facility]  PHYSICIAN:  Anselmo Rod, MD, FACGDATE OF BIRTH:  1971-02-12  DATE OF CONSULTATION:  07/28/2010 DATE OF DISCHARGE:                                CONSULTATION   REASON FOR CONSULTATION:  Rule out esophageal perforation.  ASSESSMENT: 1. Sepsis syndrome with disseminated intravascular coagulation,     question secondary to a viral infection. 2. Anemia with a drop in hemoglobin from 15.9 to 11.6, with guaiac-     positive stool, with Mallory-Weiss tear, peptic ulcer disease,     esophagitis, etc. 3. Chest pain of unclear etiology. 4. Epigastric pain with peripancreatic stranding on CT, with a normal     lipase, question perforated ulcer. 5. Guaiac-positive stool with diarrhea and abnormal-appearing cecum     and right colon on CT, question infectious colitis. 6. History of headache, nonsteroidal use and abuse in the recent past. 7. History of drug abuse with Dilaudid.  In the past the patient has     been through rehabilitation for this and is drug-free at this time. 8. Abnormal liver function tests probably secondary to disseminated     intravascular coagulation. 9. Metabolic acidosis. 10.Status post laparoscopic cholecystectomy. 11.Partial abdominal hysterectomy for endometriosis, with one ovary     saved.  RECOMMENDATIONS: 1. Agree with broad-spectrum antibiotics, vancomycin, Flagyl,     Primaxin, as discussed with Dr. Marvis Moeller. 2. CCM to intubate the patient ASAP. 3. KUB to be procured after intubation to rule out free air. 4. Intravenous PPI.  DISCUSSION:  Ms. 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  She was in her usual state of health until 07/23/2010.  On the 13th she developed acute nausea, vomiting and abdominal pain.  She  attributed this to a viral syndrome that both of her parents and her nephew had and expected to get better over the next couple of days.  She claims that over the next couple of days she felt slightly better but yesterday her symptoms worsened prompting her to come to the emergency room where she was found to have a white count of 5.2 with a hemoglobin of 15.9 and a platelet count of 131 with 90% neutrophils.  A lipase at that time was normal at 31.  CMP revealed a potassium of 3.4, potassium 137, BUN of 9, creatinine of 1.68 with a total bili of 1.3, ALT of 56 and alk phos of 116, albumin of 3.8.  PT was high at 17.1 with an INR of 1.37, PTT 36.  Lactic acid level was high. Repeat lactic acid level at 6:50 today was high at 7.  Over the course of the day today she had progressive worsening of her abdominal pain.  She had epigastric pain that she rated 9/10, but the pain got worse over the last few hours. CT scan of the abdomen and pelvis with contrast was done emergently. Dr. Freida Busman evaluated the  patient.  CT of the abdomen showed some stranding around the pancreas consistent with mild pancreatitis. No ulcer was noted as per my discussion with Dr. Audie Pinto, there was some prominence of the cecum and the right colon indicating a focal colitis with a few prominent lymph nodes in the right pelvis, a small left ovarian cyst was seen.  PAST MEDICAL HISTORY:  See list above.  ALLERGIES:  PROMETHAZINE.  HOME MEDICATIONS:  Effexor and Ativan p.r.n.  FAMILY HISTORY:  Significant for coronary artery disease in her father who had a bypass.  Breast cancer in her mother.  SOCIAL HISTORY:  She is married with three children.  She lives in [place] with her husband.  She denies any tobacco or drugs.  REVIEW OF SYSTEMS:  Severe chest pain radiating to the back, nausea, vomiting, diarrhea with fever.  PHYSICAL EXAMINATION:  GENERAL:  A very anxious but pleasant and cooperative middle-aged white  female in acute distress due to severe abdominal pain VITAL SIGNS:  Temperature 101.1, blood pressure 94/62, respiratory rate 29 per minute, O2 saturation of 100% on 2 liters of oxygen. NECK:  Supple. CHEST:  Clear to auscultation with decreased breath sounds at the bases. HEART:  S1, S2 regular. ABDOMEN:  Soft with a pierced umbilicus.  Tenderness in the epigastrium and left upper quadrant with guarding, questionable rigidity. No hepatosplenomegaly appreciated.  LABORATORY DATA:  White count 5.2 on admission, which was up to 26.2 at 11:20 this morning and 23 K at 1550 hours. PT on admission was 17.1 with INR of 1.27, hemoglobin dropped from 15.9 to 11.6. Amylase is normal at 25.  Potassium 3, sodium 133, chloride 116, CO2 16, BUN 12, creatinine 1.  Most recent hemoglobin at 2218 hours is 11.4 with a lactic acid level of 2.9, lipase of 32, amylase 56.  Urine culture shows no growth at the present time.  CO2 is 16 with a glucose of 63, BUN 12, creatinine of 1. Lipase is normal at 32 with amylase of 56 done at 2119 hours.  CMP revealed a potassium of 3, CO2 of 16, glucose 63, total bili down to 0.9 from 3.8 done earlier today, with an AST of 59, ALT of 51, albumin of 2.1.  Cardiac panel was within normal limits. D-dimer was elevated to more than 20.  BNP was high at 227.  CT of the abdomen and pelvis done at 2059 hours revealed a slight increase in small pleural effusion, some indistinct peripancreatic stranding extending into the perirenal spaces indicating mild pancreatitis, questionable mucosal edema of the colon on the right side and cecum.  CT of the chest shows slight increase of pleural effusion, as mentioned above.  CT angiogram on admission showed no evidence of pulmonary embolism. Abdominal films done earlier today showed no abnormal bowel gas pattern.  PLAN:  As above. I have discussed the patient extensively with Dr. Drue Second, Dr. Bertram Savin, Dr. Audie Pinto, Dr. Blinda Leatherwood,  CCM Fellow, and Dr. Molli Knock.  RECOMMENDATIONS:  As made. We will follow the patient along with the CCM service and make recommendations as needed.     Anselmo Rod, MD, Clementeen Graham     JNM/MEDQ  D:  07/28/2010  T:  07/29/2010  Job:  [id number]  cc:   Rachael Fee, MD  Electronically Signed by Charna Elizabeth M.D. on 08/21/2010 06:18:27 AM

## 2010-12-31 ENCOUNTER — Emergency Department (HOSPITAL_COMMUNITY): Payer: PRIVATE HEALTH INSURANCE

## 2010-12-31 ENCOUNTER — Emergency Department (HOSPITAL_COMMUNITY)
Admission: EM | Admit: 2010-12-31 | Discharge: 2010-12-31 | Disposition: A | Discharge status: Home / Self Care | Payer: PRIVATE HEALTH INSURANCE | Attending: Emergency Medicine | Admitting: Emergency Medicine

## 2010-12-31 DIAGNOSIS — R509 Fever, unspecified: Secondary | ICD-10-CM | POA: Insufficient documentation

## 2010-12-31 DIAGNOSIS — M549 Dorsalgia, unspecified: Secondary | ICD-10-CM | POA: Insufficient documentation

## 2010-12-31 DIAGNOSIS — R630 Anorexia: Secondary | ICD-10-CM | POA: Insufficient documentation

## 2010-12-31 DIAGNOSIS — R197 Diarrhea, unspecified: Secondary | ICD-10-CM | POA: Insufficient documentation

## 2010-12-31 DIAGNOSIS — R079 Chest pain, unspecified: Secondary | ICD-10-CM | POA: Insufficient documentation

## 2010-12-31 DIAGNOSIS — R112 Nausea with vomiting, unspecified: Secondary | ICD-10-CM | POA: Insufficient documentation

## 2010-12-31 DIAGNOSIS — R1013 Epigastric pain: Secondary | ICD-10-CM | POA: Insufficient documentation

## 2010-12-31 DIAGNOSIS — R634 Abnormal weight loss: Secondary | ICD-10-CM | POA: Insufficient documentation

## 2010-12-31 DIAGNOSIS — K5289 Other specified noninfective gastroenteritis and colitis: Secondary | ICD-10-CM | POA: Insufficient documentation

## 2010-12-31 LAB — URINALYSIS, ROUTINE W REFLEX MICROSCOPIC
Glucose, UA: NEGATIVE mg/dL
Ketones, ur: NEGATIVE mg/dL
Protein, ur: NEGATIVE mg/dL

## 2010-12-31 LAB — COMPREHENSIVE METABOLIC PANEL
ALT: 14 U/L (ref 0–35)
AST: 26 U/L (ref 0–37)
Alkaline Phosphatase: 71 U/L (ref 39–117)
CO2: 23 mEq/L (ref 19–32)
Chloride: 98 mEq/L (ref 96–112)
GFR calc non Af Amer: >60 mL/min (ref >60)
Sodium: 135 mEq/L (ref 135–145)
Total Bilirubin: 0.5 mg/dL (ref 0.3–1.2)

## 2010-12-31 LAB — DIFFERENTIAL
Basophils Relative: 0 % (ref 0–1)
Eosinophils Absolute: 0.1 10*3/uL (ref 0.0–0.7)
Lymphs Abs: 2.6 10*3/uL (ref 0.7–4.0)
Neutrophils Relative %: 67 % (ref 43–77)

## 2010-12-31 LAB — CBC
MCV: 80.9 fL (ref 78.0–100.0)
Platelets: 243 10*3/uL (ref 150–400)
RBC: 5.86 MIL/uL — ABNORMAL HIGH (ref 3.87–5.11)
WBC: 10.8 10*3/uL — ABNORMAL HIGH (ref 4.0–10.5)

## 2011-01-01 ENCOUNTER — Inpatient Hospital Stay (HOSPITAL_COMMUNITY)
Admission: EM | Admit: 2011-01-01 | Discharge: 2011-01-04 | DRG: 392 | Disposition: A | Discharge status: Home / Self Care | Payer: PRIVATE HEALTH INSURANCE | Attending: Internal Medicine | Admitting: Internal Medicine

## 2011-01-01 DIAGNOSIS — F17200 Nicotine dependence, unspecified, uncomplicated: Secondary | ICD-10-CM | POA: Diagnosis present

## 2011-01-01 DIAGNOSIS — K279 Peptic ulcer, site unspecified, unspecified as acute or chronic, without hemorrhage or perforation: Secondary | ICD-10-CM | POA: Diagnosis present

## 2011-01-01 DIAGNOSIS — R1013 Epigastric pain: Secondary | ICD-10-CM | POA: Diagnosis present

## 2011-01-01 DIAGNOSIS — R1115 Cyclical vomiting syndrome unrelated to migraine: Principal | ICD-10-CM | POA: Diagnosis present

## 2011-01-01 DIAGNOSIS — K2970 Gastritis, unspecified, without bleeding: Secondary | ICD-10-CM | POA: Diagnosis present

## 2011-01-01 DIAGNOSIS — K2990 Gastroduodenitis, unspecified, without bleeding: Secondary | ICD-10-CM | POA: Diagnosis present

## 2011-01-01 DIAGNOSIS — K449 Diaphragmatic hernia without obstruction or gangrene: Secondary | ICD-10-CM | POA: Diagnosis present

## 2011-01-01 DIAGNOSIS — E876 Hypokalemia: Secondary | ICD-10-CM | POA: Diagnosis present

## 2011-01-01 HISTORY — DX: Acquired absence of other specified parts of digestive tract: Z90.49

## 2011-01-01 LAB — DIFFERENTIAL
Basophils Absolute: 0 10*3/uL (ref 0.0–0.1)
Lymphocytes Relative: 30 % (ref 12–46)
Monocytes Absolute: 0.8 10*3/uL (ref 0.1–1.0)
Monocytes Relative: 7 % (ref 3–12)
Neutro Abs: 6.8 10*3/uL (ref 1.7–7.7)

## 2011-01-01 LAB — CBC
HCT: 45.2 % (ref 36.0–46.0)
Hemoglobin: 16.6 g/dL — ABNORMAL HIGH (ref 12.0–15.0)
MCHC: 36.7 g/dL — ABNORMAL HIGH (ref 30.0–36.0)
RBC: 5.61 MIL/uL — ABNORMAL HIGH (ref 3.87–5.11)

## 2011-01-01 LAB — COMPREHENSIVE METABOLIC PANEL
Alkaline Phosphatase: 75 U/L (ref 39–117)
BUN: 12 mg/dL (ref 6–23)
CO2: 24 mEq/L (ref 19–32)
GFR calc Af Amer: >60 mL/min (ref >60)
GFR calc non Af Amer: >60 mL/min (ref >60)
Glucose, Bld: 112 mg/dL — ABNORMAL HIGH (ref 70–99)
Potassium: 3.2 mEq/L — ABNORMAL LOW (ref 3.5–5.1)
Total Bilirubin: 0.5 mg/dL (ref 0.3–1.2)
Total Protein: 8 g/dL (ref 6.0–8.3)

## 2011-01-01 LAB — LIPASE, BLOOD: Lipase: 89 U/L — ABNORMAL HIGH (ref 11–59)

## 2011-01-02 ENCOUNTER — Emergency Department (HOSPITAL_COMMUNITY): Payer: PRIVATE HEALTH INSURANCE

## 2011-01-02 ENCOUNTER — Encounter (HOSPITAL_COMMUNITY): Payer: Self-pay | Admitting: Radiology

## 2011-01-02 LAB — CBC
HCT: 36.9 % (ref 36.0–46.0)
Hemoglobin: 12.7 g/dL (ref 12.0–15.0)
MCH: 28 pg (ref 26.0–34.0)
MCHC: 34.4 g/dL (ref 30.0–36.0)
MCV: 81.5 fL (ref 78.0–100.0)
RBC: 4.53 MIL/uL (ref 3.87–5.11)

## 2011-01-02 LAB — COMPREHENSIVE METABOLIC PANEL
CO2: 23 mEq/L (ref 19–32)
Calcium: 8.5 mg/dL (ref 8.4–10.5)
Creatinine, Ser: 0.77 mg/dL (ref 0.50–1.10)
GFR calc Af Amer: >60 mL/min (ref >60)
GFR calc non Af Amer: >60 mL/min (ref >60)
Glucose, Bld: 91 mg/dL (ref 70–99)
Total Protein: 6 g/dL (ref 6.0–8.3)

## 2011-01-02 LAB — DIFFERENTIAL
Lymphocytes Relative: 39 % (ref 12–46)
Lymphs Abs: 2.9 10*3/uL (ref 0.7–4.0)
Monocytes Absolute: 0.5 10*3/uL (ref 0.1–1.0)
Monocytes Relative: 7 % (ref 3–12)
Neutro Abs: 3.7 10*3/uL (ref 1.7–7.7)
Neutrophils Relative %: 51 % (ref 43–77)

## 2011-01-02 LAB — LACTATE DEHYDROGENASE: LDH: 134 U/L (ref 94–250)

## 2011-01-02 LAB — CARDIAC PANEL(CRET KIN+CKTOT+MB+TROPI): CK, MB: 1.5 ng/mL (ref 0.3–4.0)

## 2011-01-03 ENCOUNTER — Other Ambulatory Visit: Payer: Self-pay | Admitting: Gastroenterology

## 2011-01-03 LAB — COMPREHENSIVE METABOLIC PANEL
AST: 22 U/L (ref 0–37)
Albumin: 2.9 g/dL — ABNORMAL LOW (ref 3.5–5.2)
Alkaline Phosphatase: 53 U/L (ref 39–117)
BUN: 6 mg/dL (ref 6–23)
Potassium: 3.4 mEq/L — ABNORMAL LOW (ref 3.5–5.1)
Sodium: 139 mEq/L (ref 135–145)
Total Protein: 5.4 g/dL — ABNORMAL LOW (ref 6.0–8.3)

## 2011-01-03 LAB — CBC
MCH: 28.8 pg (ref 26.0–34.0)
Platelets: 141 10*3/uL — ABNORMAL LOW (ref 150–400)
RBC: 3.99 MIL/uL (ref 3.87–5.11)
WBC: 3.9 10*3/uL — ABNORMAL LOW (ref 4.0–10.5)

## 2011-01-03 LAB — IGG: IgG (Immunoglobin G), Serum: 729 mg/dL (ref 690–1700)

## 2011-01-03 LAB — DIFFERENTIAL
Basophils Absolute: 0 10*3/uL (ref 0.0–0.1)
Basophils Relative: 0 % (ref 0–1)
Eosinophils Absolute: 0.1 10*3/uL (ref 0.0–0.7)
Neutrophils Relative %: 51 % (ref 43–77)

## 2011-01-03 LAB — IGM: IgM, Serum: 96 mg/dL (ref 52–322)

## 2011-01-03 LAB — ANA: Anti Nuclear Antibody(ANA): NEGATIVE

## 2011-01-03 LAB — LIPASE, BLOOD: Lipase: 35 U/L (ref 11–59)

## 2011-01-03 LAB — RHEUMATOID FACTOR: Rhuematoid fact SerPl-aCnc: <10 IU/mL (ref <=14)

## 2011-01-03 LAB — MAGNESIUM: Magnesium: 1.8 mg/dL (ref 1.5–2.5)

## 2011-01-04 LAB — COMPREHENSIVE METABOLIC PANEL
ALT: 27 U/L (ref 0–35)
Alkaline Phosphatase: 54 U/L (ref 39–117)
CO2: 28 mEq/L (ref 19–32)
GFR calc Af Amer: >60 mL/min (ref >60)
GFR calc non Af Amer: >60 mL/min (ref >60)
Glucose, Bld: 105 mg/dL — ABNORMAL HIGH (ref 70–99)
Potassium: 2.8 mEq/L — ABNORMAL LOW (ref 3.5–5.1)
Sodium: 138 mEq/L (ref 135–145)

## 2011-01-04 LAB — PROTEIN ELECTROPHORESIS, SERUM
Albumin ELP: 56.6 % (ref 55.8–66.1)
Beta 2: 5.3 % (ref 3.2–6.5)
Beta Globulin: 5.8 % (ref 4.7–7.2)
M-Spike, %: NOT DETECTED g/dL

## 2011-01-04 LAB — CBC
Hemoglobin: 11 g/dL — ABNORMAL LOW (ref 12.0–15.0)
RBC: 3.87 MIL/uL (ref 3.87–5.11)

## 2011-01-04 LAB — DIFFERENTIAL
Basophils Absolute: 0 10*3/uL (ref 0.0–0.1)
Basophils Relative: 0 % (ref 0–1)
Neutro Abs: 1.6 10*3/uL — ABNORMAL LOW (ref 1.7–7.7)
Neutrophils Relative %: 47 % (ref 43–77)

## 2011-01-04 LAB — EHRLICHIA ANTIBODY PANEL: E chaffeensis (HGE) Ab, IgG: NEGATIVE

## 2011-01-04 MED ORDER — IOHEXOL 300 MG/ML  SOLN
80.0000 mL | Freq: Once | INTRAMUSCULAR | Status: AC | PRN
  Administered 2011-01-03: 80 mL via INTRAVENOUS

## 2011-01-04 NOTE — Discharge Summary (Signed)
[name], FEUERBORN                  ACCOUNT NO.:  [id number]  MEDICAL RECORD NO.:  [id number]  LOCATION:  3009                         FACILITY:  [facility]  PHYSICIAN:  Talmage Nap, MD  DATE OF BIRTH:  November 20, 1970  DATE OF ADMISSION:  01/01/2011 DATE OF DISCHARGE:  01/04/2011                        DISCHARGE SUMMARY - REFERRING   PRIMARY CARE PHYSICIAN:  Dr. Abbe Amsterdam at [facility].  PRIMARY RHEUMATOLOGIST:  Leo Grosser, MD, at [facility].  ADMITTING PHYSICIAN:  Carlota Raspberry, MD.  CONSULTANT INVOLVED IN THE CASE:  GI, Petra Kuba, M.D.  DISCHARGE DIAGNOSES: 1. Recurrent epigastric pain, questionable gastritis, questionable     peptic ulcer disease status post esophagogastroduodenoscopy     finding, hiatal hernia. 2. Hypokalemia. 3. Anemia. 4. Thrombocytopenia.  The patient is a 40 year old Caucasian female who had been said to be in fairly stable health, who was admitted to the hospital on January 01, 2011, with recurrent episodes of epigastric pain with associated nausea and vomiting.  The patient was also said to have some fever, chills, and rigor.  She denied any history of diarrhea.  She denied any history of chest pain or shortness of breath.  She denied any dysuria, hematuria, but the epigastric pain was said to have persisted, hence she presented to the hospital to be evaluated.  Preadmission meds include Prilosec and melatonin.  ALLERGIES:  PROMETHAZINE.  SOCIAL HISTORY:  Positive for chronic tobacco use.  Smokes about a pack per month.  Denies any history of alcohol use or street drug use.  Lives at home with her spouse, married with a kid, and she works as a Engineer, civil (consulting) at the [employer].  FAMILY HISTORY:  Positive for coronary artery disease and breast CA. There is also a family history of hypertension, hyperlipidemia which the patient claims her sister has.  PAST SURGICAL HISTORY:  Sepsis with respiratory  distress status post intubation and extubation and this was done at [facility] earlier this year about March 2012.  REVIEW OF SYSTEMS:  Essentially documented in the initial history and physical.  PHYSICAL EXAMINATION:  At the time the patient was seen by the admitting physician. VITAL SIGNS:  Temperature 98.3.  She is said to be afebrile.  Blood pressure 111//72, pulse 87, respiratory 18, saturating 100% on room air. HEENT:  Pallor, but pupils are reactive to light and extraocular movements are intact. NECK:  No jugular venous distention.  No thyromegaly and no lymphadenopathy. CHEST:  Said to be clear to auscultation. HEART:  Heart sounds are 1 and 2. ABDOMEN:  Soft with tenderness in the epigastric region.  No rebound, no guarding.  Liver, spleen, and kidney are not palpable.  Bowel sounds are positive. EXTREMITIES:  No pedal edema. NEUROLOGIC:  Nonfocal.  Neuropsychiatric evaluation unremarkable. SKIN:  Normal turgor.  LAB DATA:  Initial complete blood count with differential showed WBC of 10.8, hemoglobin of 17.4, hematocrit of 47.4, MCV of 80.9 with a platelet count of 243, normal differential.  Comprehensive metabolic panel showed sodium of 135, potassium of 4.3, chloride of 98 with a bicarb of 23, glucose was 103, BUN was 8.  LFT essentially normal. Initial  lipase was 64, elevated.  Urinalysis unremarkable.  Urine microscopy unremarkable.  Repeat complete blood count with differential done on January 01, 2011, showed WBC of 11.1, hemoglobin of 16.6, hematocrit of 45.2, MCV of 80.6 with a platelet count of 236, normal differential and comprehensive metabolic panel done showed sodium of 134, potassium of 3.2, chloride of 90 with a bicarb of 24, glucose was 112, BUN was 12, creatinine of 0.82.  LFTs normal.  Lipase done on January 01, 2011, was 89 elevated.  D-dimer 0.36.  The first set of cardiac markers troponin I less than 0.30.  ESR to LDH 134 and normal. A  repeat lipase done on January 03, 2011, pulse 35 and normal.  Magnesium level is 1.8.  Blood culture x2 no growth.  ANA negative.  C-reactive protein 0.1.  Rheumatoid factor less than 10, unremarkable.  A repeat complete blood count with differential done on January 04, 2011, showed WBC of 3.4, hemoglobin of 11.0, hematocrit of 31.5, MCV of 81.4 with a platelet count of 114, normal differential.  Magnesium level was 1.7. Comprehensive metabolic panel done on January 04, 2011, showed sodium of 138, potassium of 2.8, chloride of 104 with a bicarb of 28, glucose was 105, BUN was 2, creatinine of 0.67.  LFTs normal.  The following lab results are still pending.  That is serology IgG and IgM for Lyme disease, [facility] spotted fever, and ehrlichiosis.  PPD done on the patient was unremarkable.  Imaging studies done include chest x-ray normal.  The CT thorax showed no evidence of pancreatitis.  There is small hepatic cyst.  CT of the abdomen with contrast is unremarkable. EGD showed small hiatal hernia, otherwise unremarkable.  HOSPITAL COURSE:  The patient was admitted to regular floor with history of persistent vomiting with epigastric pain.  She was started on Reglan 5 mg p.o. q.6 hourly.  However, at the time the patient was seen by me, which was on January 03, 2011, she complained about epigastric discomfort and examination showed tenderness, and at this time the patient was made n.p.o.  She was placed on Reglan 10 mg IV q. 4 p.r.n. and started on D5 normal saline IV to go at rate of 120 mL.  Pain control was done with Dilaudid 2 mg IV q.4-6 p.r.n.  Serology IgG and IgM for Lyme disease, [facility] spotted fever, and ehrlichiosis was ordered, and at the time of the discharge summary, results are still pending.  GI physician was, however, consulted, Dr. Ewing Schlein.  I had an extensive discussion with him and the patient subsequently had EGD done on January 03, 2011, and the finding was  essentially that of small hiatal hernia with normal procedure.  He recommended that the patient could have MRI of the pancreas or endoscopic ultrasound done as an outpatient, and the patient will also benefit from adding Carafate to the patient's regimen.  The patient was, however, reevaluated by me today which is January 04, 2011, with chief complaint of epigastric discomfort and examination showed mild tenderness, but vital signs were stable.  Blood pressure 100/64 with temperatures 98.9, pulse 76, respiratory rate 18, medically stable. Since the patient is found to be hypokalemic, she will be given KCl 10 mEq IV over 1 hour x2 doses before being discharged.  The plan is for the patient to be discharged home today on activity as tolerated.  She will be followed up by her primary care physician as well as her GI physician in [facility] in  1-2 weeks.  Medications to be taken at home will include the following: 1. Carafate 1 g p.o. t.i.d. with meals. 2. Dilaudid 1 mg p.o. t.i.d. p.r.n. 3. Potassium chloride 10 mEq 1 p.o. b.i.d. 4. Levsin 0.125 mg 1 p.o. t.i.d. p.r.n. 5. Protonix 40 mg 1 p.o. b.i.d. 6. Zofran 4 mg 1 p.o. t.i.d. p.r.n. 7. Melatonin 10 mg over the counter 1 p.o. at bedtime.     Talmage Nap, MD     CN/MEDQ  D:  01/04/2011  T:  01/04/2011  Job:  [id number]  cc:   Dr. Langley Adie, MD  Electronically Signed by Talmage Nap  on 01/04/2011 05:48:55 PM

## 2011-01-08 LAB — CULTURE, BLOOD (ROUTINE X 2): Culture  Setup Time: 201208221011

## 2011-01-30 NOTE — Op Note (Signed)
  NAMEADELISA, [name]                  ACCOUNT NO.:  [id number]  MEDICAL RECORD NO.:  [id number]  LOCATION:  3009                         FACILITY:  [facility]  PHYSICIAN:  Petra Kuba, M.D.    DATE OF BIRTH:  [birth year]  DATE OF PROCEDURE: DATE OF DISCHARGE:                              OPERATIVE REPORT   PROCEDURE:  EGD with biopsy.  INDICATION:  Abdominal pain, weight loss.  Consent was signed after risks, benefits, methods, options thoroughly discussed prior to sedation.  MEDICINES USED:  Fentanyl 100 mcg, Versed 10 mg.  PROCEDURE IN DETAIL:  The video endoscope was inserted by direct vision. The proximal mid esophagus was normal and distal esophagus was a small hiatal hernia and a widely patent thin fibrous ring.  Scope passed easily into the stomach, advanced through a normal antrum, normal pylorus into a normal duodenal bulb AND around the C-loop to a normal second portion of the duodenum.  We continued to advance around the third, fourth part of the duodenum and probably past the ligament of Treitz into the jejunum which was normal.  A few scattered jejunal and duodenal biopsies were obtained as we slowly withdrew back to the bulb. Again, no duodenal abnormality was seen.  Once back in the stomach, scope was retroflexed, high in the cardia, the hiatal hernia was confirmed.  The fundus, angularis, lesser and greater curve were normal on retroflex visualization.  Straight visualization of the stomach did not reveal any additional findings.  Air was suctioned.  The scope slowly withdrawn.  Again, a good look at the esophagus was normal. However, we went ahead and took a few biopsies of the esophagus to rule out any microscopic abnormality like eosinophilic esophagitis or significant reflux problems.  Scope was withdrawn.  The patient tolerated the procedure well.  There was no obvious immediate complication.  ENDOSCOPIC DIAGNOSES: 1. Small hiatal hernia with a widely patent  fibrous thin ring. 2. Otherwise within normal limits to probable the proximal jejunum     status post esophageal, duodenal, and jejunal biopsies to rule out     microscopic abnormality.  PLAN:  Assuming biopsies are negative, further workup and plans per the hospital team.  If concerns about chronic pancreatitis are considered, might consider a pancreatic MRI or even an endoscopic ultrasound which can be set up as an outpatient.  As far as other medicine trials, might want to consider Carafate pancreatic enzymes or even antispasmodics to try.  Happy to see back p.r.n.          ______________________________ Petra Kuba, M.D.     MEM/MEDQ  D:  01/03/2011  T:  01/03/2011  Job:  [id number]  cc:   Dr. Langley Adie, MD  Electronically Signed by Vida Rigger M.D. on 01/30/2011 02:53:23 PM

## 2011-01-30 NOTE — Consult Note (Signed)
  [name], [name]                  ACCOUNT NO.:  [id number]  MEDICAL RECORD NO.:  [id number]  LOCATION:  3009                         FACILITY:  [facility]  PHYSICIAN:  Petra Kuba, M.D.    DATE OF BIRTH:  Mar 30, 1971  DATE OF CONSULTATION:  01/03/2011 DATE OF DISCHARGE:                                CONSULTATION   HISTORY:  The patient seen at the request of the hospitalist for abdominal pain and doubtful pancreatitis.  She had been worked up by the other team in March when she had a much worse requiring intubation and a prolonged hospital stay questionably all due to a virus, but she has continued to lose weight and had multiple other symptoms.  She has been worked up by a rheumatologist at [facility] but presented with increased pain, some nausea, vomiting, persistent fevers and a few days of diarrhea, although she has not had much lower bowel problems and I am consulted for consideration of an endoscopy.  PAST MEDICAL HISTORY:  Otherwise negative other than her recent problems.  ALLERGIES:  PROMETHAZINE which causes leg shaking.  HOME MEDICATIONS:  Prilosec, melatonin for 2 weeks, and Reglan.  FAMILY HISTORY:  Pertinent for mother with breast cancer and a grandfather with colon cancer and an alcoholic grandfather with pancreatitis but no other obvious GI problems in the family.  No sprue or weight related issues.  REVIEW OF SYSTEMS:  Pertinent for the Prilosec and the Reglan really have not been helping.  She has not tried pancreatic enzymes and has not ever had an endoscopy.  PHYSICAL EXAMINATION:  GENERAL:  No acute distress, lying comfortably in the bed. VITAL SIGNS:  Stable, afebrile.  Sclerae nonicteric. LUNGS:  Clear. HEART:  Regular rate and rhythm. ABDOMEN:  Little tender but soft, good bowel sounds.  No guarding or rebound.  LABORATORY DATA:  Pertinent for a negative ANA today.  Lipase today is normal, was less than 2 times normal a few times during this  hospital stay.  BUN and creatinine and liver tests were all normal.  Albumin is 2.9.  Sed rate is 2.  CBC is pertinent for being normal.  MCV 81. Repeat CT is pertinent for a small hepatic cyst but otherwise negative. No signs of pancreatitis.  Chest CT showed a little atelectasis but otherwise negative.  ASSESSMENT: 1. Multiple somatic complaints. 2. Abdominal pain, doubtful pancreatitis. 3. Weight loss.  PLAN:  Risks, benefits, methods of endoscopy were discussed.  We will proceed to rule out ulcers and sprue or other etiologies with further workup and plans pending those findings.          ______________________________ Petra Kuba, M.D.     MEM/MEDQ  D:  01/03/2011  T:  01/03/2011  Job:  [id number]  cc:   Leo Grosser, M.D. Dr. Abbe Amsterdam  Electronically Signed by Vida Rigger M.D. on 01/30/2011 02:53:18 PM

## 2011-02-13 NOTE — H&P (Signed)
  [name], [name]                  ACCOUNT NO.:  [id number]  MEDICAL RECORD NO.:  [id number]  LOCATION:  3009                         FACILITY:  [facility]  PHYSICIAN:  Carlota Raspberry, MD         DATE OF BIRTH:  04-07-1971  DATE OF ADMISSION:  01/01/2011 DATE OF DISCHARGE:                             HISTORY & PHYSICAL   ADDENDUM:  Upon further discussion with the patient, she also endorses that she has been having early morning joint stiffness and pain in her wrists and in her fingers, but also a lot of arthralgias in her knees. Of note, no abnormalities were noted on physical exam in any of her joints though.  I also discussed doing an HIV test; however, she states that she recently had one that was negative through her PCP's office and therefore we will not do this.  We will place a PPD though as she has not had one apparently since February before all of these symptoms started.          ______________________________ Carlota Raspberry, MD     EB/MEDQ  D:  01/02/2011  T:  01/02/2011  Job:  [id number]  Electronically Signed by Carlota Raspberry MD on 02/13/2011 12:05:09 PM

## 2011-02-13 NOTE — H&P (Signed)
[name], [name]                  ACCOUNT NO.:  [id number]  MEDICAL RECORD NO.:  [id number]  LOCATION:  3009                         FACILITY:  [facility]  PHYSICIAN:  Carlota Raspberry, MD         DATE OF BIRTH:  03/07/1971  DATE OF ADMISSION:  01/01/2011 DATE OF DISCHARGE:                             HISTORY & PHYSICAL   PRIMARY CARE PHYSICIAN:  Dr. Abbe Amsterdam.  He is through the [facility]  RHEUMATOLOGIST:  Leo Grosser, MD also through [facility].  CHIEF COMPLAINT:  Epigastric pain, vomiting and fevers, chills, and sweats.  HISTORY OF PRESENT ILLNESS:  This is a 40 year old female with a complicated history that started in March 2012 at which point she was in her usual state of health when she was admitted to the [facility] with DIC thought due to sepsis and was also intubated from July 28, 2010 to July 31, 2010.  Her final diagnosis was essentially unclear but it was thought due to potentially gastroenteritis versus colitis versus question pancreatitis due to some peripancreatic stranding.  However, it appears that her lipase was normal during that admission.  She did also appear to have DIC with anemia and thrombocytopenia and elevated LFTs and chest pain during that admission.  Again, the exact etiology of her illness was unclear but it was thought to be due to some type of GI illness due to nausea and vomiting and diarrhea that was pretty profuse apparently.  She also, of note, required 2 pressors during that ICU stay.  The patient was discharged and was going along until April the next month when she was actually admitted to [facility] with continued fevers and was admitted there and states that she got 4 days of IV antibiotics for a 1 single positive blood culture that she is not clear what the bacteria was.  However, she was discharged on no antibiotics and so it is unclear if that was a potential contaminant.  She states that since April, she has continued to have  ongoing occasional fevers, chills, and drenching sweats, and her temperatures have been as high as 101 which have been actually recorded in her PCP's office.  She also endorses a 36-pound weight loss in the last 4-5 months.  She has no appetite and has significant fatigue.  She is losing her hair as well and has general malaise and feels awful.  These symptoms have been going off and on since March but she has had no further vomiting or diarrhea since then until the last 1-2 days when she started developing these and also epigastric and chest pain that is characterized by a pressure and a stabbing in her lower sternum/upper epigastrium the radiates to her back and is relieved with leaning forward.  Also, significant in her history is that she states she has very bad teeth and that they are all rotted and that her dentist had previously had plans to remove all of her teeth, but does not want to do so until all of her febrile illness is resolved.  She also significantly states that she had an abscess in her mouth 1 month ago for which she  was given a course of amoxicillin.  She was brought into the [facility] ED where she was found to be tachycardic to 108 with blood pressure 119/88 and afebrile at 98.5 and oxygenating without any problems.  Through her workup in the emergency room, she has had a normal chemistry and normal LFTs.  Her lipase is only very minimally elevated at 89.  Her white blood cell count is a bit elevated at 11.1 with a normal differential.  Her crit and platelets, D- dimer were all normal.  Her calcium was also a bit elevated at 11.5. Chest x-ray was negative.  She is being admitted for further management of persistent epigastric pain, nausea.  Review of systems is as above, otherwise also positive for 2 episodes of a superficial thrombophlebitis in her right lower extremity in the last 1-2 months.  She is clear that these were not DVTs though and they were only  treated with a little bit of aspirin.  She has also been having insomnia due to all the fevers, chills, and sweats.  REVIEW OF SYSTEMS:  Negative for any headaches, vision changes, HEENT problems, sinus issues, dysphasia, cough, rash, or other skin changes, bladder issues, tingling or numbness in her extremities or any focal neurological deficits.  PAST MEDICAL HISTORY: 1. Admission to [facility] in March 2012 with DIC ?sepsis of unclear     etiology requiring intubation, ventilation, and 2 pressors.  During     that episode, she had acute renal failure, transaminitis, chest     pain.  Etiology was unclear but questioned pancreatitis given     peripancreatic stranding but normal lipase and also gastroenteritis     versus colitis given diarrhea and abdominal pain. 2. Otherwise, she has been fairly healthy.  ALLERGIES:  PROMETHAZINE.  HOME MEDICATIONS:  Prilosec, Bailey, melatonin at bedtime and Reglan.  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  She started smoking at 40 years old and has been smoking about a pack per month.  She does not drink alcohol the last 2-3 months since she has been ill.  She specifically denies any history of IV drug use and does not do any other drugs.  FAMILY HISTORY:  Her father has had five AMIs.  Her mother is a breast cancer survivor and there appear to be colon, brain, and lung cancer on her mother's side.  Her sister is alive and has hypertension and hyperlipidemia.  PHYSICAL EXAMINATION:  VITAL SIGNS:  Temperature is 98.3 and she has been afebrile through the ED course, blood pressure is 111/72, pulse 87, respirations 18, 100% on room air. GENERAL:  She is an average-sized female in the ED stretcher.  Her sister and her mother are at the bedside.  She does not appear acutely ill but does appear a little bit uncomfortable but not in distress.  She is able to provide a good  history. HEENT: Her pupils are equal, round, reactive to light.  Extraocular muscles are intact.  Her sclerae are clear and anicteric with no conjunctivitis.  Her mouth is moist and fairly normal appearing.  I appreciate no oral ulcers.  She does have a hole in her left soft palate just above the tonsils that she relates to a prior tonsillectomy and is old.  She has significant caries and extremely poor dentition throughout. NECK:  Supple.  I do not appreciate any thyromegaly.  There is no cervical  lymphadenopathy.  Her neck rises symmetrically to swallowing. LUNGS:  Clear to auscultation bilaterally with no wheezes, crackles, rales, or rhonchi. HEART:  Regular rate and rhythm.  I do not appreciate even the slightest murmur or any gallops. ABDOMEN:  Soft and scaphoid.  It is not distended and there are positive bowel sounds, however she does have facial grimacing and looks quite in pain when I palpate in her epigastric area.  I do not appreciate any hepatosplenomegaly. EXTREMITIES:  Warm, well-perfused without any cyanosis or clubbing.  I do not appreciate any lesions on her fingertips or in her fingernails (i.e. Janeway lesions or Osler nodes).  Her radial pulse is easily palpable on the right but not as easily palpated on the left.  There is no bilateral lower extremity edema. LYMPHATICS:  I do not appreciate any cervical, axillary, or inguinal lymphadenopathy to deep palpation. SKIN:  Without any worrisome rashes or any gross abnormalities. NEUROLOGIC:  She is completely intact with no gross focal neurological deficits.  Cranial nerves II-XII are intact.  She is spontaneously moving all of her extremities.  Her mood and affect are appropriate.  LABORATORY VALUES:  Per HPI.  Her white blood cell count was 10.8 on initial evaluation and has risen to 11.1, hematocrit at 47.4, 45.2.  Her platelet count is normal.  She has a normal differential.  Her D-dimer is 0.36, i.e. negative.   Her chemistry panel is significant for mild hyponatremia at 134, her potassium is 3.2, chloride 98, CO2 of 24, glucose 112.  BUN and creatinine are 12 and 0.82.  T bili, alk phos, ALT, and AST are all normal.  Her total protein is 8.0 and albumin is 4.4.  Her calcium on arrival is 11.5 and currently at 10.2.  Lipase was 64 and 89.  Her UA shows a small amount of blood with 0-2 rbc's.  Her leukocyte esterase and nitrites are negative.  RADIOLOGY:  Chest x-ray done in the emergency room shows no cardiopulmonary process seen.  IMPRESSION:  This is a 40 year old female who was essentially healthy until March 2012 at which point she was admitted to [facility] with apparent disseminated intravascular coagulation sepsis of unclear etiology necessitating intubation/ventilation and 2 pressors.  The etiology was not entirely clear upon discharge, but it was thought to be potentially pancreatitis due to peripancreatic stranding on imaging versus colitis/gastroenteritis due to diarrhea and abdominal pain. Since that admission, she has had persistent fevers, chills, and drenching sweats and has lost 36 pounds and has malaise and fatigue and anorexia.  These symptoms have been off and on since April, however she has had no vomiting and diarrhea since then until the last 1-2 days and now presents with nausea, vomiting, and epigastric/lower sternal chest pain.  Given these symptoms, she certainly has some type of systemic illness and at this point a fever of unknown origin. 1. Fever of unknown origin.  Broadly speaking, 3 potential sources     would be infectious, malignant versus connective tissue     disease/rheumatological etiology.  Regarding infectious sources, her teeth certainly are very rotten and she states she had an abscess in her mouth as early as last month.  This raises the specter of endocarditis and she states she was unable to get a TEE due to insurance purposes but this had been  broached. Alternatively, she could not have endocarditis but occasional seeding of blood from her teeth.  Regarding malignant possibilities, I do not appreciate any lymphadenopathy throughout and her  differential at this point is normal making leukemia/lymphoma a bit less likely.  She does noticeably have epigastric pain and did previously have some peripancreatic stranding raising the specter of some type of gastric malignancy or pancreatic malignancy.  Rheumatologic diseases also seems reasonable especially given that she states that she saw a rheumatologist in the [facility] system and had a positive ANA, but she is unclear how high the titer was.  She also states previously that her ESR was very elevated, but upon repeat was not elevated.  A vasculitic type process seems reasonable as does the potential for sarcoid given her age and sex and also a very minimally elevated calcium on arrival.  Therefore at this point, we should continue to trend out her CBC with differentials and also get a peripheral smear.  We will get an ESR, a CRP, a CK, and blood cultures from different sites.  We should  get a serum LDH and ANA and RF and an SPEP.  I would also like to trend out her Chemistry-10.  We will not start her on any antibiotics at this point given no clear infectious source.  I will also get a CT abdomen and chest with IV contrast.  As I discussed with her, we will hold off on a TTE/TEE at this point because any further complicated workup at this point would be further fragmenting her care between [facility] and her known providers in the [facility] system including her PCP and rheumatologist.  As I also discussed with her family, I would recommend that should her symptoms continue and the fevers source not be found, she should be referred to Infectious Diseases and Hematology/Oncology for consideration of more rare entities like HLH or familial Mediterranean fever, but again, I think that  these would be rare considerations once more common things are ruled out.  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  We will continue to treat her symptomatically with Reglan and Zofran which have helped improve her nausea in the emergency room and continue to run maintenance fluids at 125 an hour for 1 liter.  Otherwise, she should get Tylenol for any fevers, which should be well-documented should they arise.  FEN:  IV fluids as above, otherwise she should stay on a clear liquid diet for now.  IV access is through one peripheral IV.  Code status is full code as discussed.  The patient will be admitted to [facility] team-7.          ______________________________ Carlota Raspberry, MD     EB/MEDQ  D:  01/02/2011  T:  01/02/2011  Job:  [id number]  Electronically Signed by Carlota Raspberry MD on 02/13/2011 12:05:05 PM

## 2011-05-22 ENCOUNTER — Emergency Department: Payer: Self-pay | Admitting: Emergency Medicine

## 2011-05-22 LAB — COMPREHENSIVE METABOLIC PANEL
Alkaline Phosphatase: 70 U/L (ref 50–136)
BUN: 9 mg/dL (ref 7–18)
Chloride: 99 mmol/L (ref 98–107)
Co2: 32 mmol/L (ref 21–32)
EGFR (African American): >60
EGFR (Non-African Amer.): >60
Glucose: 99 mg/dL (ref 65–99)
SGOT(AST): 17 U/L (ref 15–37)
SGPT (ALT): 18 U/L
Sodium: 139 mmol/L (ref 136–145)

## 2011-05-22 LAB — CBC
HCT: 44 % (ref 35.0–47.0)
Platelet: 278 10*3/uL (ref 150–440)
RBC: 5.28 10*6/uL — ABNORMAL HIGH (ref 3.80–5.20)
RDW: 13.5 % (ref 11.5–14.5)
WBC: 7.7 10*3/uL (ref 3.6–11.0)

## 2011-05-22 LAB — TROPONIN I: Troponin-I: <0.02 ng/mL

## 2011-05-23 LAB — URINALYSIS, COMPLETE
Glucose,UR: NEGATIVE mg/dL (ref 0–75)
Leukocyte Esterase: NEGATIVE
Nitrite: NEGATIVE
Protein: NEGATIVE

## 2011-05-23 LAB — RAPID INFLUENZA A&B ANTIGENS

## 2011-08-03 ENCOUNTER — Other Ambulatory Visit: Payer: Self-pay

## 2011-08-03 ENCOUNTER — Inpatient Hospital Stay (HOSPITAL_COMMUNITY)
Admission: EM | Admit: 2011-08-03 | Discharge: 2011-08-07 | DRG: 372 | Disposition: A | Discharge status: Home / Self Care | Payer: PRIVATE HEALTH INSURANCE | Attending: Internal Medicine | Admitting: Internal Medicine

## 2011-08-03 DIAGNOSIS — M549 Dorsalgia, unspecified: Secondary | ICD-10-CM | POA: Diagnosis present

## 2011-08-03 DIAGNOSIS — M419 Scoliosis, unspecified: Secondary | ICD-10-CM

## 2011-08-03 DIAGNOSIS — F329 Major depressive disorder, single episode, unspecified: Secondary | ICD-10-CM | POA: Diagnosis present

## 2011-08-03 DIAGNOSIS — E860 Dehydration: Secondary | ICD-10-CM

## 2011-08-03 DIAGNOSIS — E871 Hypo-osmolality and hyponatremia: Secondary | ICD-10-CM | POA: Diagnosis present

## 2011-08-03 DIAGNOSIS — R1013 Epigastric pain: Secondary | ICD-10-CM | POA: Diagnosis present

## 2011-08-03 DIAGNOSIS — F411 Generalized anxiety disorder: Secondary | ICD-10-CM | POA: Diagnosis present

## 2011-08-03 DIAGNOSIS — R109 Unspecified abdominal pain: Secondary | ICD-10-CM | POA: Diagnosis present

## 2011-08-03 DIAGNOSIS — R112 Nausea with vomiting, unspecified: Secondary | ICD-10-CM | POA: Diagnosis present

## 2011-08-03 DIAGNOSIS — D61818 Other pancytopenia: Secondary | ICD-10-CM | POA: Diagnosis present

## 2011-08-03 DIAGNOSIS — M4120 Other idiopathic scoliosis, site unspecified: Secondary | ICD-10-CM | POA: Diagnosis present

## 2011-08-03 DIAGNOSIS — G4700 Insomnia, unspecified: Secondary | ICD-10-CM | POA: Diagnosis present

## 2011-08-03 DIAGNOSIS — E876 Hypokalemia: Secondary | ICD-10-CM | POA: Diagnosis present

## 2011-08-03 DIAGNOSIS — N83209 Unspecified ovarian cyst, unspecified side: Secondary | ICD-10-CM | POA: Diagnosis present

## 2011-08-03 DIAGNOSIS — A0472 Enterocolitis due to Clostridium difficile, not specified as recurrent: Principal | ICD-10-CM | POA: Diagnosis present

## 2011-08-03 DIAGNOSIS — R1115 Cyclical vomiting syndrome unrelated to migraine: Secondary | ICD-10-CM | POA: Diagnosis present

## 2011-08-03 DIAGNOSIS — F3289 Other specified depressive episodes: Secondary | ICD-10-CM | POA: Diagnosis present

## 2011-08-03 HISTORY — DX: Major depressive disorder, single episode, unspecified: F32.9

## 2011-08-03 HISTORY — DX: Coagulation defect, unspecified: D68.9

## 2011-08-03 HISTORY — DX: Depression, unspecified: F32.A

## 2011-08-03 HISTORY — DX: Anxiety disorder, unspecified: F41.9

## 2011-08-03 LAB — CBC
MCH: 28.3 pg (ref 26.0–34.0)
MCHC: 35.3 g/dL (ref 30.0–36.0)
Platelets: 143 10*3/uL — ABNORMAL LOW (ref 150–400)
RDW: 13.6 % (ref 11.5–15.5)

## 2011-08-03 LAB — DIFFERENTIAL
Basophils Absolute: 0 10*3/uL (ref 0.0–0.1)
Basophils Relative: 0 % (ref 0–1)
Eosinophils Absolute: 0.1 10*3/uL (ref 0.0–0.7)
Neutro Abs: 6.6 10*3/uL (ref 1.7–7.7)
Neutrophils Relative %: 85 % — ABNORMAL HIGH (ref 43–77)

## 2011-08-03 MED ORDER — SODIUM CHLORIDE 0.9 % IV BOLUS (SEPSIS)
1000.0000 mL | Freq: Once | INTRAVENOUS | Status: AC
  Administered 2011-08-03: 1000 mL via INTRAVENOUS

## 2011-08-03 MED ORDER — ONDANSETRON 4 MG PO TBDP
ORAL_TABLET | ORAL | Status: AC
  Filled 2011-08-03: qty 1

## 2011-08-03 MED ORDER — ONDANSETRON HCL 8 MG PO TABS: 8.0000 mg | ORAL_TABLET | Freq: Once | ORAL | Status: DC

## 2011-08-03 MED ORDER — FENTANYL CITRATE 0.05 MG/ML IJ SOLN
50.0000 ug | Freq: Once | INTRAMUSCULAR | Status: AC
  Administered 2011-08-03: via INTRAVENOUS
  Filled 2011-08-03: qty 2

## 2011-08-03 MED ORDER — ONDANSETRON 4 MG PO TBDP
ORAL_TABLET | ORAL | Status: AC
  Administered 2011-08-04: 4 mg via INTRAVENOUS
  Filled 2011-08-03: qty 2

## 2011-08-03 MED ORDER — ONDANSETRON HCL 4 MG/2ML IJ SOLN
4.0000 mg | Freq: Once | INTRAMUSCULAR | Status: AC
  Administered 2011-08-03: 4 mg via INTRAVENOUS
  Filled 2011-08-03: qty 2

## 2011-08-03 NOTE — ED Notes (Addendum)
Pt states had diarrhea stated on wed.  N/V began today and has not eased off. Pt states vomiting X10 today. Pt alert oriented X4

## 2011-08-03 NOTE — ED Notes (Signed)
Patient is unable to void at present. 

## 2011-08-03 NOTE — ED Notes (Addendum)
Pt states emesis x 10, diarrhea, lower back pain since Wednesday. Pain radiates to right flank.

## 2011-08-04 ENCOUNTER — Emergency Department (HOSPITAL_COMMUNITY): Payer: PRIVATE HEALTH INSURANCE

## 2011-08-04 ENCOUNTER — Encounter (HOSPITAL_COMMUNITY): Payer: Self-pay | Admitting: Emergency Medicine

## 2011-08-04 DIAGNOSIS — E876 Hypokalemia: Secondary | ICD-10-CM | POA: Diagnosis present

## 2011-08-04 DIAGNOSIS — E871 Hypo-osmolality and hyponatremia: Secondary | ICD-10-CM | POA: Diagnosis present

## 2011-08-04 DIAGNOSIS — M419 Scoliosis, unspecified: Secondary | ICD-10-CM | POA: Diagnosis present

## 2011-08-04 DIAGNOSIS — M549 Dorsalgia, unspecified: Secondary | ICD-10-CM | POA: Diagnosis present

## 2011-08-04 DIAGNOSIS — N83209 Unspecified ovarian cyst, unspecified side: Secondary | ICD-10-CM | POA: Diagnosis present

## 2011-08-04 DIAGNOSIS — R109 Unspecified abdominal pain: Secondary | ICD-10-CM | POA: Diagnosis present

## 2011-08-04 DIAGNOSIS — E860 Dehydration: Secondary | ICD-10-CM | POA: Diagnosis present

## 2011-08-04 DIAGNOSIS — R112 Nausea with vomiting, unspecified: Secondary | ICD-10-CM | POA: Diagnosis present

## 2011-08-04 LAB — URINALYSIS, ROUTINE W REFLEX MICROSCOPIC
Glucose, UA: NEGATIVE mg/dL
Nitrite: NEGATIVE
Specific Gravity, Urine: 1.026 (ref 1.005–1.030)
pH: 8 (ref 5.0–8.0)

## 2011-08-04 LAB — DIC (DISSEMINATED INTRAVASCULAR COAGULATION)PANEL
Fibrinogen: 340 mg/dL (ref 204–475)
Platelets: 142 10*3/uL — ABNORMAL LOW (ref 150–400)
Smear Review: NONE SEEN
aPTT: 33 seconds (ref 24–37)

## 2011-08-04 LAB — URINE MICROSCOPIC-ADD ON

## 2011-08-04 LAB — COMPREHENSIVE METABOLIC PANEL
AST: 15 U/L (ref 0–37)
Albumin: 3.8 g/dL (ref 3.5–5.2)
Alkaline Phosphatase: 76 U/L (ref 39–117)
BUN: 12 mg/dL (ref 6–23)
Chloride: 97 mEq/L (ref 96–112)
Potassium: 2.9 mEq/L — ABNORMAL LOW (ref 3.5–5.1)
Sodium: 134 mEq/L — ABNORMAL LOW (ref 135–145)
Total Protein: 7.3 g/dL (ref 6.0–8.3)

## 2011-08-04 LAB — PROTIME-INR
INR: 1.11 (ref 0.00–1.49)
Prothrombin Time: 14.5 seconds (ref 11.6–15.2)

## 2011-08-04 LAB — CLOSTRIDIUM DIFFICILE BY PCR: Toxigenic C. Difficile by PCR: POSITIVE — AB

## 2011-08-04 LAB — MRSA PCR SCREENING: MRSA by PCR: NEGATIVE

## 2011-08-04 LAB — LIPASE, BLOOD: Lipase: 59 U/L (ref 11–59)

## 2011-08-04 MED ORDER — DULOXETINE HCL 60 MG PO CPEP
60.0000 mg | ORAL_CAPSULE | Freq: Every day | ORAL | Status: DC
  Administered 2011-08-04 – 2011-08-07 (×4): 60 mg via ORAL
  Filled 2011-08-04 (×4): qty 1

## 2011-08-04 MED ORDER — FENTANYL CITRATE 0.05 MG/ML IJ SOLN
50.0000 ug | Freq: Once | INTRAMUSCULAR | Status: AC
  Administered 2011-08-04: 03:00:00 via INTRAVENOUS

## 2011-08-04 MED ORDER — ONDANSETRON HCL 4 MG/2ML IJ SOLN
4.0000 mg | Freq: Once | INTRAMUSCULAR | Status: AC
  Administered 2011-08-04: 4 mg via INTRAVENOUS
  Filled 2011-08-04: qty 2

## 2011-08-04 MED ORDER — IOHEXOL 300 MG/ML  SOLN
20.0000 mL | INTRAMUSCULAR | Status: AC
  Administered 2011-08-04: 20 mL via ORAL

## 2011-08-04 MED ORDER — FENTANYL CITRATE 0.05 MG/ML IJ SOLN
INTRAMUSCULAR | Status: AC
  Filled 2011-08-04: qty 2

## 2011-08-04 MED ORDER — HYDROMORPHONE HCL PF 1 MG/ML IJ SOLN
2.0000 mg | INTRAMUSCULAR | Status: DC | PRN
  Administered 2011-08-04 (×4): 2 mg via INTRAVENOUS
  Administered 2011-08-05: 1 mg via INTRAVENOUS
  Administered 2011-08-05 – 2011-08-06 (×6): 2 mg via INTRAVENOUS
  Filled 2011-08-04 (×2): qty 1
  Filled 2011-08-04 (×9): qty 2

## 2011-08-04 MED ORDER — METOCLOPRAMIDE HCL 5 MG/ML IJ SOLN
5.0000 mg | Freq: Four times a day (QID) | INTRAMUSCULAR | Status: DC | PRN
  Administered 2011-08-04 – 2011-08-05 (×3): 5 mg via INTRAVENOUS
  Filled 2011-08-04 (×8): qty 1

## 2011-08-04 MED ORDER — FENTANYL CITRATE 0.05 MG/ML IJ SOLN
50.0000 ug | Freq: Once | INTRAMUSCULAR | Status: AC
  Administered 2011-08-04: 01:00:00 via INTRAVENOUS
  Filled 2011-08-04: qty 2

## 2011-08-04 MED ORDER — SODIUM CHLORIDE 0.9 % IJ SOLN
3.0000 mL | Freq: Two times a day (BID) | INTRAMUSCULAR | Status: DC
  Administered 2011-08-06 – 2011-08-07 (×2): 3 mL via INTRAVENOUS

## 2011-08-04 MED ORDER — ONDANSETRON HCL 4 MG/2ML IJ SOLN
4.0000 mg | Freq: Four times a day (QID) | INTRAMUSCULAR | Status: DC | PRN
  Administered 2011-08-04 – 2011-08-05 (×3): 4 mg via INTRAVENOUS
  Filled 2011-08-04 (×3): qty 2

## 2011-08-04 MED ORDER — POTASSIUM CHLORIDE 10 MEQ/100ML IV SOLN
10.0000 meq | INTRAVENOUS | Status: AC
  Administered 2011-08-04 (×3): 10 meq via INTRAVENOUS
  Filled 2011-08-04 (×4): qty 100
  Filled 2011-08-04: qty 200
  Filled 2011-08-04: qty 100

## 2011-08-04 MED ORDER — POTASSIUM CHLORIDE CRYS ER 20 MEQ PO TBCR
40.0000 meq | EXTENDED_RELEASE_TABLET | Freq: Two times a day (BID) | ORAL | Status: DC
  Administered 2011-08-04 – 2011-08-05 (×2): 40 meq via ORAL
  Filled 2011-08-04 (×3): qty 2

## 2011-08-04 MED ORDER — KCL IN DEXTROSE-NACL 40-5-0.9 MEQ/L-%-% IV SOLN
INTRAVENOUS | Status: DC
  Administered 2011-08-04 – 2011-08-05 (×3): via INTRAVENOUS
  Filled 2011-08-04 (×7): qty 1000

## 2011-08-04 MED ORDER — METRONIDAZOLE 500 MG PO TABS
500.0000 mg | ORAL_TABLET | Freq: Three times a day (TID) | ORAL | Status: DC
  Administered 2011-08-04 – 2011-08-06 (×5): 500 mg via ORAL
  Filled 2011-08-04 (×9): qty 1

## 2011-08-04 MED ORDER — CLONAZEPAM 0.5 MG PO TABS
0.5000 mg | ORAL_TABLET | Freq: Every day | ORAL | Status: DC
  Administered 2011-08-04 – 2011-08-06 (×3): 0.5 mg via ORAL
  Filled 2011-08-04 (×3): qty 1

## 2011-08-04 MED ORDER — ONDANSETRON HCL 4 MG PO TABS: 4.0000 mg | ORAL_TABLET | Freq: Four times a day (QID) | ORAL | Status: DC | PRN

## 2011-08-04 MED ORDER — IOHEXOL 300 MG/ML  SOLN
100.0000 mL | Freq: Once | INTRAMUSCULAR | Status: AC | PRN
  Administered 2011-08-04: 100 mL via INTRAVENOUS

## 2011-08-04 NOTE — ED Notes (Signed)
Called report to [phone]. 

## 2011-08-04 NOTE — ED Notes (Signed)
Pt requesting pain meds

## 2011-08-04 NOTE — ED Provider Notes (Addendum)
History     CSN: [id number]  Arrival date & time 08/03/11  2218   First MD Initiated Contact with Patient 08/03/11 2312      Chief Complaint  Patient presents with  . Emesis  . Flank Pain    (Consider location/radiation/quality/duration/timing/severity/associated sxs/prior treatment) Patient is a 41 y.o. female presenting with vomiting and abdominal pain. The history is provided by the patient and the spouse. No language interpreter was used.  Emesis  This is a recurrent problem. The current episode started yesterday. The problem occurs more than 10 times per day. The problem has not changed since onset.The emesis has an appearance of stomach contents. There has been no fever. Associated symptoms include abdominal pain and diarrhea. Risk factors: unknown.  Abdominal Pain The primary symptoms of the illness include abdominal pain, vomiting and diarrhea. The primary symptoms of the illness do not include shortness of breath. The current episode started more than 2 days ago. The onset of the illness was sudden. The problem has not changed since onset. The abdominal pain began more than 2 days ago. The pain came on suddenly. The abdominal pain has been unchanged since its onset. The abdominal pain is located in the epigastric region. Pain radiation: back. The severity of the abdominal pain is 10/10. The abdominal pain is relieved by nothing. Exacerbated by: nothing.  The vomiting began yesterday. Vomiting occurs more than 10 times per day. The emesis contains stomach contents. Risk factors: none.  The diarrhea began 3 to 5 days ago. The diarrhea is watery. The diarrhea occurs more than 10 times per day. Risk factors: unknown.  Associated with: none. The patient states that she believes she is currently not pregnant. The patient has had a change in bowel habit. Risk factors for an acute abdominal problem include a history of abdominal surgery. Symptoms associated with the illness do not include  diaphoresis. Significant associated medical issues do not include diabetes.    Past Medical History  Diagnosis Date  . S/P laparoscopic cholecystectomy     No past surgical history on file.  No family history on file.  History  Substance Use Topics  . Smoking status: Not on file  . Smokeless tobacco: Not on file  . Alcohol Use: Not on file    OB History    Grav Para Term Preterm Abortions TAB SAB Ect Mult Living                  Review of Systems  Constitutional: Negative for diaphoresis.  HENT: Negative.   Eyes: Negative.   Respiratory: Negative.  Negative for shortness of breath.   Cardiovascular: Negative for chest pain.  Gastrointestinal: Positive for vomiting, abdominal pain and diarrhea.  Genitourinary: Negative.   Musculoskeletal: Negative.   Skin: Negative.   Neurological: Negative.   Hematological: Negative.   Psychiatric/Behavioral: Negative.     Allergies  Phenergan  Home Medications   Current Outpatient Rx  Name Route Sig Dispense Refill  . CLONAZEPAM 0.5 MG PO TABS Oral Take 0.5 mg by mouth at bedtime as needed. For sleep    . DULOXETINE HCL 60 MG PO CPEP Oral Take 60 mg by mouth daily.    Marland Kitchen ONDANSETRON HCL 8 MG PO TABS Oral Take 8 mg by mouth every 8 (eight) hours as needed. For nausea      BP 105/67  Pulse 83  Temp(Src) 99.4 F (37.4 C) (Oral)  Resp 16  SpO2 98%  Physical Exam  Constitutional: She is oriented  to person, place, and time. She appears well-developed and well-nourished. No distress.  HENT:  Head: Normocephalic and atraumatic.  Mouth/Throat: Oropharynx is clear and moist.  Eyes: Conjunctivae are normal. Pupils are equal, round, and reactive to light.  Neck: Normal range of motion. Neck supple.  Cardiovascular: Normal rate and regular rhythm.   Pulmonary/Chest: Effort normal and breath sounds normal. She has no wheezes. She has no rales.  Abdominal: Soft. Bowel sounds are normal. She exhibits no mass. There is no rebound  and no guarding.    Musculoskeletal: Normal range of motion. She exhibits no edema.  Neurological: She is alert and oriented to person, place, and time.  Skin: Skin is warm and dry.  Psychiatric: She has a normal mood and affect.    ED Course  Procedures (including critical care time)  Labs Reviewed  CBC - Abnormal; Notable for the following:    Platelets 143 (*)    All other components within normal limits  DIFFERENTIAL - Abnormal; Notable for the following:    Neutrophils Relative 85 (*)    Lymphocytes Relative 7 (*)    Lymphs Abs 0.6 (*)    All other components within normal limits  COMPREHENSIVE METABOLIC PANEL - Abnormal; Notable for the following:    Sodium 134 (*)    Potassium 2.9 (*)    Glucose, Bld 119 (*)    All other components within normal limits  URINALYSIS, ROUTINE W REFLEX MICROSCOPIC - Abnormal; Notable for the following:    Color, Urine AMBER (*) BIOCHEMICALS MAY BE AFFECTED BY COLOR   APPearance CLOUDY (*)    Hgb urine dipstick MODERATE (*)    Bilirubin Urine SMALL (*)    Ketones, ur 15 (*)    Protein, ur 30 (*)    Leukocytes, UA SMALL (*)    All other components within normal limits  URINE MICROSCOPIC-ADD ON - Abnormal; Notable for the following:    Squamous Epithelial / LPF FEW (*)    All other components within normal limits  LIPASE, BLOOD  LACTIC ACID, PLASMA  POCT I-STAT TROPONIN I  PROTIME-INR  POCT PREGNANCY, URINE  MAGNESIUM  URINALYSIS, ROUTINE W REFLEX MICROSCOPIC  URINE CULTURE  PROTIME-INR   Dg Abd Acute W/chest  08/04/2011  *RADIOLOGY REPORT*  Clinical Data:  Nausea, vomiting, mid and lower back pain, abdominal pain, diarrhea  ACUTE ABDOMEN SERIES (ABDOMEN 2 VIEW & CHEST 1 VIEW)  Comparison: Chest radiograph 01/02/2011, abdominal radiograph 07/28/2010  Findings: Normal heart size, mediastinal contours, and pulmonary vascularity. Lungs clear. No pleural effusion or pneumothorax. Nonspecific bowel gas pattern. No bowel dilatation, bowel  wall thickening or free intraperitoneal air. Surgical clips right upper quadrant question cholecystectomy. Bones unremarkable.  IMPRESSION: No acute abnormalities.  Original Report Authenticated By: Lollie Marrow, M.D.     1. Dehydration   2. Hypokalemia       MDM   Date: 08/04/2011  Rate: 91  Rhythm: normal sinus rhythm  QRS Axis: normal  Intervals: normal  ST/T Wave abnormalities: nonspecific ST/T changes  Conduction Disutrbances:none  Narrative Interpretation:   Old EKG Reviewed: changes noted   CRITICAL CARE Performed by: Jasmine Awe   Total critical care time: 30 minutes  Critical care time was exclusive of separately billable procedures and treating other patients.  Critical care was necessary to treat or prevent imminent or life-threatening deterioration.  Critical care was time spent personally by me on the following activities: development of treatment plan with patient and/or surrogate as well as nursing,  discussions with consultants, evaluation of patient's response to treatment, examination of patient, obtaining history from patient or surrogate, ordering and performing treatments and interventions, ordering and review of laboratory studies, ordering and review of radiographic studies, pulse oximetry and re-evaluation of patient's condition.      Jasmine Awe, MD 08/04/11 0305  Islam Eichinger K Elizet Kaplan-Rasch, MD 08/04/11 [phone]

## 2011-08-04 NOTE — Progress Notes (Signed)
2041 C. Diff culture came back positive.  Contacted [name] and orders were given to start on Flagyl.  Will continue with enteric isolation. [name]

## 2011-08-04 NOTE — H&P (Signed)
Sueko Gillentine is an 41 y.o. female.   Chief Complaint: Nausea vomiting abdominal pain HPI: This is a 25 one year old female who has been in and out of the hospital with similar complaints. She was at [facility] also in December with similar problem. Her main problem has apparently been nausea vomiting and abdominal pain which has persisted. 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 This was associated with the nausea vomiting and she had workup here including upper G. I endoscopy. Patient return now with another episode. She is scheduled to have a colonoscopy at [facility] coming up these April 29. She denies melena or bright red blood per rectum. Patient's problem started with diarrhea a day ago this was followed by the intractable nausea vomiting and flank pain. Hot pain is now relieved with some fentanyl however she is feeling miserable and not able to keep anything down. Pain is in the epigastric region and is rated a 10 out of 10 radiating to the back no dysuria no fever no diaphoresis.  Past Medical History  Diagnosis Date  . S/P laparoscopic cholecystectomy   . Clotting disorder   . Depression   . Anxiety     History reviewed. No pertinent past surgical history.  History reviewed. No pertinent family history. Social History:  does not have a smoking history on file. She does not have any smokeless tobacco history on file. Her alcohol and drug histories not on file.  Allergies:  Allergies  Allergen Reactions  . Phenergan Other (See Comments)    Restless legs    Medications Prior to Admission  Medication Dose Route Frequency Provider Last Rate Last Dose  . fentaNYL (SUBLIMAZE) injection 50 mcg  50 mcg Intravenous Once April K Palumbo-Rasch, MD      . fentaNYL (SUBLIMAZE) injection 50 mcg  50 mcg Intravenous Once April K Palumbo-Rasch, MD      . fentaNYL (SUBLIMAZE) injection 50 mcg  50 mcg Intravenous Once April K Palumbo-Rasch, MD      . iohexol  (OMNIPAQUE) 300 MG/ML solution 20 mL  20 mL Oral Q1 Hr x 2 Medication Radiologist, MD   20 mL at 08/04/11 0310  . ondansetron [facility]) injection 4 mg  4 mg Intravenous Once April K Palumbo-Rasch, MD   4 mg at 08/03/11 2341  . ondansetron (ZOFRAN) injection 4 mg  4 mg Intravenous Once April K Palumbo-Rasch, MD   4 mg at 08/04/11 0419  . ondansetron (ZOFRAN-ODT) 4 MG disintegrating tablet        4 mg at 08/04/11 0411  . potassium chloride 10 mEq in 100 mL IVPB  10 mEq Intravenous Q1 Hr x 4 April K Palumbo-Rasch, MD   10 mEq at 08/04/11 0350  . sodium chloride 0.9 % bolus 1,000 mL  1,000 mL Intravenous Once April K Palumbo-Rasch, MD   1,000 mL at 08/03/11 2341  . DISCONTD: ondansetron (ZOFRAN) tablet 8 mg  8 mg Oral Once April K Palumbo-Rasch, MD      . DISCONTD: ondansetron (ZOFRAN-ODT) 4 MG disintegrating tablet            No current outpatient prescriptions on file as of 08/03/2011.    Results for orders placed during the hospital encounter of 08/03/11 (from the past 48 hour(s))  CBC     Status: Abnormal   Collection Time   08/03/11 11:36 PM      Component Value Range Comment   WBC 7.8  4.0 - 10.5 (  K/uL)    RBC 4.60  3.87 - 5.11 (MIL/uL)    Hemoglobin 13.0  12.0 - 15.0 (g/dL)    HCT 45.4  09.8 - 11.9 (%)    MCV 80.0  78.0 - 100.0 (fL)    MCH 28.3  26.0 - 34.0 (pg)    MCHC 35.3  30.0 - 36.0 (g/dL)    RDW 14.7  82.9 - 56.2 (%)    Platelets 143 (*) 150 - 400 (K/uL)   DIFFERENTIAL     Status: Abnormal   Collection Time   08/03/11 11:36 PM      Component Value Range Comment   Neutrophils Relative 85 (*) 43 - 77 (%)    Neutro Abs 6.6  1.7 - 7.7 (K/uL)    Lymphocytes Relative 7 (*) 12 - 46 (%)    Lymphs Abs 0.6 (*) 0.7 - 4.0 (K/uL)    Monocytes Relative 7  3 - 12 (%)    Monocytes Absolute 0.5  0.1 - 1.0 (K/uL)    Eosinophils Relative 1  0 - 5 (%)    Eosinophils Absolute 0.1  0.0 - 0.7 (K/uL)    Basophils Relative 0  0 - 1 (%)    Basophils Absolute 0.0  0.0 - 0.1 (K/uL)   COMPREHENSIVE  METABOLIC PANEL     Status: Abnormal   Collection Time   08/03/11 11:36 PM      Component Value Range Comment   Sodium 134 (*) 135 - 145 (mEq/L)    Potassium 2.9 (*) 3.5 - 5.1 (mEq/L)    Chloride 97  96 - 112 (mEq/L)    CO2 23  19 - 32 (mEq/L)    Glucose, Bld 119 (*) 70 - 99 (mg/dL)    BUN 12  6 - 23 (mg/dL)    Creatinine, Ser 1.30  0.50 - 1.10 (mg/dL)    Calcium 9.5  8.4 - 10.5 (mg/dL)    Total Protein 7.3  6.0 - 8.3 (g/dL)    Albumin 3.8  3.5 - 5.2 (g/dL)    AST 15  0 - 37 (U/L)    ALT 7  0 - 35 (U/L)    Alkaline Phosphatase 76  39 - 117 (U/L)    Total Bilirubin 0.8  0.3 - 1.2 (mg/dL)    GFR calc non Af Amer >90  >90 (mL/min)    GFR calc Af Amer >90  >90 (mL/min)   LIPASE, BLOOD     Status: Normal   Collection Time   08/03/11 11:36 PM      Component Value Range Comment   Lipase 59  11 - 59 (U/L)   LACTIC ACID, PLASMA     Status: Normal   Collection Time   08/03/11 11:37 PM      Component Value Range Comment   Lactic Acid, Venous 1.8  0.5 - 2.2 (mmol/L)   POCT I-STAT TROPONIN I     Status: Normal   Collection Time   08/03/11 11:45 PM      Component Value Range Comment   Troponin i, poc 0.01  0.00 - 0.08 (ng/mL)    Comment 3            PROTIME-INR     Status: Normal   Collection Time   08/04/11 12:24 AM      Component Value Range Comment   Prothrombin Time 14.5  11.6 - 15.2 (seconds)    INR 1.11  0.00 - 1.49    URINALYSIS, ROUTINE W REFLEX MICROSCOPIC  Status: Abnormal   Collection Time   08/04/11  1:38 AM      Component Value Range Comment   Color, Urine AMBER (*) YELLOW  BIOCHEMICALS MAY BE AFFECTED BY COLOR   APPearance CLOUDY (*) CLEAR     Specific Gravity, Urine 1.026  1.005 - 1.030     pH 8.0  5.0 - 8.0     Glucose, UA NEGATIVE  NEGATIVE (mg/dL)    Hgb urine dipstick MODERATE (*) NEGATIVE     Bilirubin Urine SMALL (*) NEGATIVE     Ketones, ur 15 (*) NEGATIVE (mg/dL)    Protein, ur 30 (*) NEGATIVE (mg/dL)    Urobilinogen, UA 1.0  0.0 - 1.0 (mg/dL)    Nitrite  NEGATIVE  NEGATIVE     Leukocytes, UA SMALL (*) NEGATIVE    URINE MICROSCOPIC-ADD ON     Status: Abnormal   Collection Time   08/04/11  1:38 AM      Component Value Range Comment   Squamous Epithelial / LPF FEW (*) RARE     WBC, UA 3-6  <3 (WBC/hpf)    RBC / HPF 3-6  <3 (RBC/hpf)    Bacteria, UA RARE  RARE     Urine-Other MUCOUS PRESENT     POCT PREGNANCY, URINE     Status: Normal   Collection Time   08/04/11  1:49 AM      Component Value Range Comment   Preg Test, Ur NEGATIVE  NEGATIVE    MAGNESIUM     Status: Normal   Collection Time   08/04/11  2:27 AM      Component Value Range Comment   Magnesium 1.8  1.5 - 2.5 (mg/dL)    Dg Abd Acute W/chest  08/04/2011  *RADIOLOGY REPORT*  Clinical Data:  Nausea, vomiting, mid and lower back pain, abdominal pain, diarrhea  ACUTE ABDOMEN SERIES (ABDOMEN 2 VIEW & CHEST 1 VIEW)  Comparison: Chest radiograph 01/02/2011, abdominal radiograph 07/28/2010  Findings: Normal heart size, mediastinal contours, and pulmonary vascularity. Lungs clear. No pleural effusion or pneumothorax. Nonspecific bowel gas pattern. No bowel dilatation, bowel wall thickening or free intraperitoneal air. Surgical clips right upper quadrant question cholecystectomy. Bones unremarkable.  IMPRESSION: No acute abnormalities.  Original Report Authenticated By: Lollie Marrow, M.D.    Review of Systems  Constitutional: Positive for malaise/fatigue.  Gastrointestinal: Positive for heartburn, nausea, vomiting, abdominal pain and diarrhea. Negative for constipation, blood in stool and melena.  Genitourinary: Positive for flank pain. Negative for urgency and frequency.  Neurological: Positive for weakness.  All other systems reviewed and are negative.    Blood pressure 105/67, pulse 83, temperature 99.4 F (37.4 C), temperature source Oral, resp. rate 16, SpO2 98.00%. Physical Exam  Nursing note and vitals reviewed. Constitutional: She is oriented to person, place, and time. She  appears well-developed and well-nourished.  HENT:  Head: Normocephalic and atraumatic.  Right Ear: External ear normal.  Left Ear: External ear normal.  Nose: Nose normal.  Mouth/Throat: Oropharynx is clear and moist.  Eyes: Conjunctivae and EOM are normal. Pupils are equal, round, and reactive to light.  Neck: Normal range of motion. Neck supple.  Cardiovascular: Normal rate, regular rhythm, normal heart sounds and intact distal pulses.   Respiratory: Effort normal and breath sounds normal.  GI: Soft. Bowel sounds are normal. She exhibits no mass. There is tenderness. There is no rebound and no guarding.  Musculoskeletal: Normal range of motion.  Neurological: She is alert and oriented to person, place,  and time. She has normal reflexes.  Skin: Skin is warm and dry.  Psychiatric: She has a normal mood and affect. Her behavior is normal. Thought content normal.     Assessment/Plan This is a 41 year old female with intractable nausea vomiting and abdominal pain #1 abdominal pain; this seems to be persistent or recurrent with unknown etiology. We will admit the patient gets CT abdomen and pelvis pain control and consider GI consult once again #2 nausea and vomiting; we will treat the patient with a combination of Zofran and Reglan she is allergic to Phenergan #3 hypokalemia: more than likely this is from her nausea and vomiting. We will replete her potassium and check a magnesium level #4 dehydration; we will hydrate the patient aggressively #5 history of DIC: Patient apparently had DIC during her previous episodes. We will therefore check a DIC panel this time around. It is not clear why she had DIC in the first place the we'll review previous records and if date we will get hematology involved. Krystelle Prashad,LAWAL 08/04/2011, 5:00 AM

## 2011-08-04 NOTE — Progress Notes (Signed)
Subjective: 3 loose bm s  No further vomiting         Physical Exam: Blood pressure 106/69, pulse 64, temperature 97.9 F (36.6 C), temperature source Oral, resp. rate 16, height 5\' 7"  (1.702 m), weight 60.646 kg (133 lb 11.2 oz), SpO2 98.00%. Patient Vitals for the past 24 hrs:  BP Temp Temp src Pulse Resp SpO2 Height Weight  08/04/11 1500 106/69 mmHg 97.9 F (36.6 C) Oral 64  16  98 % - -  08/04/11 0531 153/91 mmHg - - 62  20  100 % 5\' 7"  (1.702 m) 60.646 kg (133 lb 11.2 oz)  08/04/11 0152 105/67 mmHg 99.4 F (37.4 C) Oral 83  16  98 % - -  08/03/11 2238 130/89 mmHg 99.8 F (37.7 C) Oral 116  18  97 % - -      Investigations:  Recent Results (from the past 240 hour(s))  MRSA PCR SCREENING     Status: Normal   Collection Time   08/04/11  6:50 AM      Component Value Range Status Comment   MRSA by PCR NEGATIVE  NEGATIVE  Final      Basic Metabolic Panel:  Basename 08/04/11 0227 08/03/11 2336  NA -- 134*  K -- 2.9*  CL -- 97  CO2 -- 23  GLUCOSE -- 119*  BUN -- 12  CREATININE -- 0.57  CALCIUM -- 9.5  MG 1.8 --  PHOS -- --   Liver Function Tests:  [facility] 08/03/11 2336  AST 15  ALT 7  ALKPHOS 76  BILITOT 0.8  PROT 7.3  ALBUMIN 3.8   Results for [name], [name] (MRN [id number]) as of 08/04/2011 15:13  Ref. Range 08/04/2011 01:38  Color, Urine Latest Range: YELLOW  AMBER (A)  APPearance Latest Range: CLEAR  CLOUDY (A)  Specific Gravity, Urine Latest Range: 1.005-1.030  1.026  pH Latest Range: 5.0-8.0  8.0  Glucose, UA Latest Range: NEGATIVE mg/dL NEGATIVE  Bilirubin Urine Latest Range: NEGATIVE  SMALL (A)  Ketones, ur Latest Range: NEGATIVE mg/dL 15 (A)  Protein Latest Range: NEGATIVE mg/dL 30 (A)  Urobilinogen, UA Latest Range: 0.0-1.0 mg/dL 1.0  Nitrite Latest Range: NEGATIVE  NEGATIVE  Leukocytes, UA Latest Range: NEGATIVE  SMALL (A)  Hgb urine dipstick Latest Range: NEGATIVE  MODERATE (A)  Urine-Other No range found MUCOUS PRESENT  WBC, UA Latest  Range: <3 WBC/hpf 3-6  RBC / HPF Latest Range: <3 RBC/hpf 3-6  Squamous Epithelial / LPF Latest Range: RARE  FEW (A)  Bacteria, UA Latest Range: RARE  RARE    CBC:  Basename 08/04/11 0800 08/03/11 2336  WBC -- 7.8  NEUTROABS -- 6.6  HGB -- 13.0  HCT -- 36.8  MCV -- 80.0  PLT 142* 143*    Ct Abdomen Pelvis W Contrast  08/04/2011  *RADIOLOGY REPORT*  Clinical Data: Abdominal pain  CT ABDOMEN AND PELVIS WITH CONTRAST  Technique:  Multidetector CT imaging of the abdomen and pelvis was performed following the standard protocol during bolus administration of intravenous contrast. Sagittal and coronal MPR images reconstructed from axial data set.  Contrast:  Dilute oral contrast. 100 ml Omnipaque 300 IV.  Comparison: 08/22/2012CT abdomen, 08/01/2010 CT abdomen pelvis  Findings: Minimal dependent atelectasis at lung bases. Gallbladder surgically absent. Small hepatic cysts. Splenic enlargement. No additional focal abnormalities of liver, spleen, pancreas, kidneys, or adrenal glands identified. Normal appendix.  Low lying cecum in the right pelvis. Unremarkable bladder and uterus. Septated mass superior posterior to the  uterus, 6.0 x 5.9 x 5.2 cm in size likely ovarian origin though uncertain if left versus right, favor left. No additional masses, adenopathy, free fluid or hernia. Stomach and bowel loops grossly unremarkable, though colon is underdistended and inadequately opacified by contrast rendering assessment of wall thickness suboptimal. No acute osseous findings.  IMPRESSION: Splenic enlargement. Small hepatic cysts. Septated low attenuation mass adjacent to the uterus, suspect complicated cystic ovarian mass; recommend characterization by transabdominal and transvaginal sonography of the pelvis.  Original Report Authenticated By: Lollie Marrow, M.D.   Dg Abd Acute W/chest  08/04/2011  *RADIOLOGY REPORT*  Clinical Data:  Nausea, vomiting, mid and lower back pain, abdominal pain, diarrhea  ACUTE  ABDOMEN SERIES (ABDOMEN 2 VIEW & CHEST 1 VIEW)  Comparison: Chest radiograph 01/02/2011, abdominal radiograph 07/28/2010  Findings: Normal heart size, mediastinal contours, and pulmonary vascularity. Lungs clear. No pleural effusion or pneumothorax. Nonspecific bowel gas pattern. No bowel dilatation, bowel wall thickening or free intraperitoneal air. Surgical clips right upper quadrant question cholecystectomy. Bones unremarkable.  IMPRESSION: No acute abnormalities.  Original Report Authenticated By: Lollie Marrow, M.D.      Medications:  Scheduled:    . clonazePAM  0.5 mg Oral QHS  . DULoxetine  60 mg Oral Daily  . fentaNYL  50 mcg Intravenous Once  . fentaNYL  50 mcg Intravenous Once  . fentaNYL  50 mcg Intravenous Once  . iohexol  20 mL Oral Q1 Hr x 2  . ondansetron  4 mg Intravenous Once  . ondansetron (ZOFRAN) IV  4 mg Intravenous Once  . ondansetron      . potassium chloride  10 mEq Intravenous Q1 Hr x 4  . potassium chloride  40 mEq Oral BID  . sodium chloride  1,000 mL Intravenous Once  . sodium chloride  3 mL Intravenous Q12H  . DISCONTD: ondansetron  8 mg Oral Once   Continuous:    . dextrose 5 % and 0.9 % NaCl with KCl 40 mEq/L 125 mL/hr at 08/04/11 0600   [id number], iohexol, metoCLOPramide (REGLAN) injection, ondansetron (ZOFRAN) IV, ondansetron  Impression:  Principal Problem:  *Nausea and vomiting Active Problems:  Abdominal pain of unknown etiology  Hypokalemia  Dehydration  Ovarian cystic mass  Hyponatremia  Back pain  Scoliosis     Plan: Advance diet Check stool for c diff Check cortisol level Check transvaginal US Await urine culture  Po Kcl    LOS: 1 day   Fatima Fedie, MD Pager: [phone] 08/04/2011, 3:43 PM

## 2011-08-05 ENCOUNTER — Inpatient Hospital Stay (HOSPITAL_COMMUNITY): Payer: PRIVATE HEALTH INSURANCE

## 2011-08-05 LAB — URINE CULTURE
Colony Count: 75000
Culture  Setup Time: 201303241146

## 2011-08-05 LAB — CBC
HCT: 31.6 % — ABNORMAL LOW (ref 36.0–46.0)
Hemoglobin: 10.9 g/dL — ABNORMAL LOW (ref 12.0–15.0)
MCH: 27.8 pg (ref 26.0–34.0)
MCV: 80.6 fL (ref 78.0–100.0)

## 2011-08-05 LAB — COMPREHENSIVE METABOLIC PANEL
ALT: 6 U/L (ref 0–35)
CO2: 25 mEq/L (ref 19–32)
Calcium: 8.7 mg/dL (ref 8.4–10.5)
Creatinine, Ser: 0.63 mg/dL (ref 0.50–1.10)
GFR calc Af Amer: >90 mL/min (ref >90)
GFR calc non Af Amer: >90 mL/min (ref >90)
Glucose, Bld: 110 mg/dL — ABNORMAL HIGH (ref 70–99)
Sodium: 136 mEq/L (ref 135–145)

## 2011-08-05 LAB — CORTISOL-AM, BLOOD: Cortisol - AM: 2.3 ug/dL — ABNORMAL LOW (ref 4.3–22.4)

## 2011-08-05 MED ORDER — OXYCODONE-ACETAMINOPHEN 5-325 MG PO TABS: 1.0000 | ORAL_TABLET | ORAL | Status: DC | PRN

## 2011-08-05 MED ORDER — ONDANSETRON HCL 8 MG PO TABS
8.0000 mg | ORAL_TABLET | Freq: Three times a day (TID) | ORAL | Status: DC
  Administered 2011-08-05 – 2011-08-07 (×6): 8 mg via ORAL
  Filled 2011-08-05 (×9): qty 1

## 2011-08-05 NOTE — Progress Notes (Signed)
Utilization Review Completed.Lynanne Delgreco T3/25/2013   

## 2011-08-05 NOTE — Progress Notes (Signed)
I have personally seen and examined [name]  I agree with PE/AP as per Heather Ill, [name] note Add zofran tid ac with meals to help with added nausea from flagyl Change narcotic to percocet [name]

## 2011-08-05 NOTE — Progress Notes (Signed)
INITIAL ADULT NUTRITION ASSESSMENT Date: 08/05/2011   Time: 2:41 PM Reason for Assessment: Nutrition Risk  ASSESSMENT: Female 41 y.o.  Dx: Nausea and vomiting  Hx:  Past Medical History  Diagnosis Date  . S/P laparoscopic cholecystectomy   . Clotting disorder   . Depression   . Anxiety     Related Meds:     . clonazePAM  0.5 mg Oral QHS  . DULoxetine  60 mg Oral Daily  . metroNIDAZOLE  500 mg Oral Q8H  . ondansetron  8 mg Oral TID AC  . sodium chloride  3 mL Intravenous Q12H  . DISCONTD: potassium chloride  40 mEq Oral BID     Ht: 5\' 7"  (170.2 cm)  Wt: 133 lb 11.2 oz (60.646 kg)  Ideal Wt: 61.3 kg % Ideal Wt: 99%  Usual Wt: 86.4 kg (1 year ago) % Usual Wt: 70%  Body mass index is 20.94 kg/(m^2). WNL  Food/Nutrition Related Hx: Pt reports no appetite for about 1 year, some N/V. Reports 50-60 lb weight loss in 1 year  Labs:  CMP     Component Value Date/Time   NA 136 08/04/2011 2336   K 3.6 08/04/2011 2336   CL 103 08/04/2011 2336   CO2 25 08/04/2011 2336   GLUCOSE 110* 08/04/2011 2336   BUN 4* 08/04/2011 2336   CREATININE 0.63 08/04/2011 2336   CALCIUM 8.7 08/04/2011 2336   PROT 5.9* 08/04/2011 2336   ALBUMIN 3.0* 08/04/2011 2336   AST 12 08/04/2011 2336   ALT 6 08/04/2011 2336   ALKPHOS 58 08/04/2011 2336   BILITOT 0.4 08/04/2011 2336   GFRNONAA >90 08/04/2011 2336   GFRAA >90 08/04/2011 2336     Intake/Output Summary (Last 24 hours) at 08/05/11 1443 Last data filed at 08/05/11 1300  Gross per 24 hour  Intake 2393.75 ml  Output      1 ml  Net 2392.75 ml      Diet Order: General  Supplements/Tube Feeding: none  IVF:    dextrose 5 % and 0.9 % NaCl with KCl 40 mEq/L Last Rate: 50 mL/hr at 08/04/11 2225    Estimated Nutritional Needs:   Kcal: 1600-1800 Protein:  60-70 gm Fluid:  1.6 - 1.8 L  Patient reports that she has not had an appetite for over 1 year. States she can go all day with out eating and not be hungry. If she does try to eat, easily  becomes nauseated.  Pt with 57 lbs (30%) weight loss in 1 year per patient report. Patient also reports eating very little, and skipping many meals, when she does eat often will vomit, <75% for greater then one month. Pt meets criteria for severe malnutrition in the context of chronic illness 2/2 to weight loss and intake as noted above. Patient has tried Ensure previously, does not like, agreeable to snacks.    Since admission N/V has stopped but is now + for C. Diff. Zofran added with meals.   NUTRITION DIAGNOSIS: -Inadequate oral intake (NI-2.1).  Status: Ongoing  RELATED TO: lack of appetite, N/V  AS EVIDENCE BY: 30% weight loss in 1 year  MONITORING/EVALUATION(Goals): Goal:  PO intake of meals and snacks will meet >90% of estimated nutrition needs  Monitor: PO intake, weight, labs, possible etiology of abd pain  EDUCATION NEEDS: -No education needs identified at this time  INTERVENTION: 1. Add snacks 2. RD will continue to follow  Dietitian [phone]  DOCUMENTATION CODES Per approved criteria  -Severe malnutrition in the  context of chronic illness    Clarene Duke MARIE 08/05/2011, 2:41 PM

## 2011-08-05 NOTE — Progress Notes (Signed)
Patient ID: [name], female   DOB: [birth year], 41 y.o.   MRN: [id number] Subjective:  I'm having some abdominal pain.  Patient just back from Transvaginal U/S.   Physical Exam: Blood pressure 133/79, pulse 57, temperature 97.9 F (36.6 C), temperature source Oral, resp. rate 18, height 5\' 7"  (1.702 m), weight 60.646 kg (133 lb 11.2 oz), SpO2 98.00%. Patient Vitals for the past 24 hrs:  BP Temp Temp src Pulse Resp SpO2  08/05/11 0536 133/79 mmHg 97.9 F (36.6 C) Oral 57  18  98 %  08/04/11 2149 120/79 mmHg 98 F (36.7 C) Oral 64  16  97 %  08/04/11 1500 106/69 mmHg 97.9 F (36.6 C) Oral 64  16  98 %      Investigations:  Recent Results (from the past 240 hour(s))  MRSA PCR SCREENING     Status: Normal   Collection Time   08/04/11  6:50 AM      Component Value Range Status Comment   MRSA by PCR NEGATIVE  NEGATIVE  Final   CLOSTRIDIUM DIFFICILE BY PCR     Status: Abnormal   Collection Time   08/04/11  6:54 PM      Component Value Range Status Comment   C difficile by pcr POSITIVE (*) NEGATIVE  Final      Basic Metabolic Panel:  Basename 08/04/11 2336 08/04/11 0227 08/03/11 2336  NA 136 -- 134*  K 3.6 -- 2.9*  CL 103 -- 97  CO2 25 -- 23  GLUCOSE 110* -- 119*  BUN 4* -- 12  CREATININE 0.63 -- 0.57  CALCIUM 8.7 -- 9.5  MG -- 1.8 --  PHOS -- -- --   Liver Function Tests:  [facility] 08/04/11 2336 08/03/11 2336  AST 12 15  ALT 6 7  ALKPHOS 58 76  BILITOT 0.4 0.8  PROT 5.9* 7.3  ALBUMIN 3.0* 3.8   Results for [name] (MRN [id number]) as of 08/04/2011 15:13  Ref. Range 08/04/2011 01:38  Color, Urine Latest Range: YELLOW  AMBER (A)  APPearance Latest Range: CLEAR  CLOUDY (A)  Specific Gravity, Urine Latest Range: 1.005-1.030  1.026  pH Latest Range: 5.0-8.0  8.0  Glucose, UA Latest Range: NEGATIVE mg/dL NEGATIVE  Bilirubin Urine Latest Range: NEGATIVE  SMALL (A)  Ketones, ur Latest Range: NEGATIVE mg/dL 15 (A)  Protein Latest Range: NEGATIVE mg/dL 30 (A)    Urobilinogen, UA Latest Range: 0.0-1.0 mg/dL 1.0  Nitrite Latest Range: NEGATIVE  NEGATIVE  Leukocytes, UA Latest Range: NEGATIVE  SMALL (A)  Hgb urine dipstick Latest Range: NEGATIVE  MODERATE (A)  Urine-Other No range found MUCOUS PRESENT  WBC, UA Latest Range: <3 WBC/hpf 3-6  RBC / HPF Latest Range: <3 RBC/hpf 3-6  Squamous Epithelial / LPF Latest Range: RARE  FEW (A)  Bacteria, UA Latest Range: RARE  RARE    CBC:  Basename 08/04/11 2336 08/04/11 0800 08/03/11 2336  WBC 3.0* -- 7.8  NEUTROABS -- -- 6.6  HGB 10.9* -- 13.0  HCT 31.6* -- 36.8  MCV 80.6 -- 80.0  PLT 134* 142* --    Ct Abdomen Pelvis W Contrast  08/04/2011  *RADIOLOGY REPORT*  Clinical Data: Abdominal pain  CT ABDOMEN AND PELVIS WITH CONTRAST   IMPRESSION: Splenic enlargement. Small hepatic cysts. Septated low attenuation mass adjacent to the uterus, suspect complicated cystic ovarian mass; recommend characterization by transabdominal and transvaginal sonography of the pelvis.  Original Report   Dg Abd Acute W/chest  08/04/2011  *RADIOLOGY REPORT*  Clinical Data:  Nausea, vomiting, mid and lower back pain, abdominal pain, diarrhea  ACUTE ABDOMEN SERIES (ABDOMEN 2 VIEW & CHEST 1 VIEW)   IMPRESSION: No acute abnormalities.  Original Report Authenticated By: Lollie Marrow, M.D.   Medications:  Scheduled:    . clonazePAM  0.5 mg Oral QHS  . DULoxetine  60 mg Oral Daily  . metroNIDAZOLE  500 mg Oral Q8H  . potassium chloride  40 mEq Oral BID  . sodium chloride  3 mL Intravenous Q12H   Continuous:    . dextrose 5 % and 0.9 % NaCl with KCl 40 mEq/L 50 mL/hr at 08/04/11 2225   [id number], metoCLOPramide (REGLAN) injection, ondansetron (ZOFRAN) IV, ondansetron  Impression:  Principal Problem:  *Nausea and vomiting Active Problems:  Abdominal pain of unknown etiology  Hypokalemia  Dehydration  Ovarian cystic mass  Hyponatremia  Back pain  Scoliosis     Plan: 1.  No longer Nausea and  Vomiting.  2.  C-diff +.  Started on Flagyl 3/25.  3.  Ovarian Cyst.  Transvaginal U/S completed.  Possible retracted clot.  Hemorrhagic cyst.  Follow up ultrasound in 6 - 12 weeks.  4.  Mild Pan-cytopenia.  Check smear.    LOS: 2 days   Conley Canal [phone] 08/05/2011, 10:04 AM

## 2011-08-06 LAB — CBC
Hemoglobin: 12.4 g/dL (ref 12.0–15.0)
MCH: 27.7 pg (ref 26.0–34.0)
MCHC: 34.3 g/dL (ref 30.0–36.0)
Platelets: 155 10*3/uL (ref 150–400)
RDW: 13.7 % (ref 11.5–15.5)

## 2011-08-06 LAB — BASIC METABOLIC PANEL
BUN: 7 mg/dL (ref 6–23)
Calcium: 9.1 mg/dL (ref 8.4–10.5)
Creatinine, Ser: 0.71 mg/dL (ref 0.50–1.10)
GFR calc Af Amer: >90 mL/min (ref >90)
GFR calc non Af Amer: >90 mL/min (ref >90)
Glucose, Bld: 89 mg/dL (ref 70–99)

## 2011-08-06 MED ORDER — HYDROMORPHONE HCL PF 1 MG/ML IJ SOLN
1.0000 mg | INTRAMUSCULAR | Status: DC | PRN
  Administered 2011-08-06: 1 mg via INTRAVENOUS
  Filled 2011-08-06: qty 1

## 2011-08-06 MED ORDER — OXYCODONE-ACETAMINOPHEN 5-325 MG PO TABS: 2.0000 | ORAL_TABLET | ORAL | Status: DC | PRN

## 2011-08-06 MED ORDER — VANCOMYCIN 50 MG/ML ORAL SOLUTION
125.0000 mg | Freq: Four times a day (QID) | ORAL | Status: DC
  Administered 2011-08-06 – 2011-08-07 (×5): 125 mg via ORAL
  Filled 2011-08-06 (×8): qty 2.5

## 2011-08-06 MED ORDER — HYDROMORPHONE HCL 2 MG PO TABS
4.0000 mg | ORAL_TABLET | ORAL | Status: DC | PRN
  Administered 2011-08-06 – 2011-08-07 (×5): 4 mg via ORAL
  Filled 2011-08-06 (×2): qty 1
  Filled 2011-08-06: qty 2
  Filled 2011-08-06 (×2): qty 1
  Filled 2011-08-06 (×2): qty 2

## 2011-08-06 NOTE — Progress Notes (Signed)
   CARE MANAGEMENT NOTE 08/06/2011  Patient:  [name], [name]   Account Number:  [id number]  Date Initiated:  08/06/2011  Documentation initiated by:  Donn Pierini  Subjective/Objective Assessment:   Pt admitted with N/V abd pain---+ c-diff     Action/Plan:   PTA pt lived at home with spouse, was independent with ADLs   Anticipated DC Date:  08/07/2011   Anticipated DC Plan:  HOME/SELF CARE      DC Planning Services  CM consult      Choice offered to / List presented to:             Status of service:  In process, will continue to follow Medicare Important Message given?   (If response is "NO", the following Medicare IM given date fields will be blank) Date Medicare IM given:   Date Additional Medicare IM given:    Discharge Disposition:    Per UR Regulation:    If discussed at Long Length of Stay Meetings, dates discussed:    Comments:  PCPOlin Hauser  08/06/11- 1625- Donn Pierini RN, BSN [phone] Spoke with pt regarding d/c needs- pt do go home on oral solution vancomycin- benefits check in progress. Pt states that she uses [facility] or [facility]- will check to see if they carry drug needed. If not [facility] or [facility] can be used. CM to follow up when benefits check returns

## 2011-08-06 NOTE — Progress Notes (Signed)
Patient ID: [name], female   DOB: [birth year], 40 y.o.   MRN: [id number]  Subjective:  Difficulty with Nausea and Abdominal pain.  Requesting Nausea medicine (Reglan) and IV pain medication.  10 diarrhea bms  last night, 3 so far today.  No further vomiting.  Patient also complains of  1.  Insomnia 2.  Sweating (diaphoresis) 3.  60 lb unintentional weight loss in the past 12 mos.  Previous evaluation by her PCP resulted in starting cymbalta, but [name] has continued to drop weight unintentionally.   Physical Exam: Blood pressure 116/77, pulse 57, temperature 97.5 F (36.4 C), temperature source Oral, resp. rate 18, height 5\' 7"  (1.702 m), weight 60.646 kg (133 lb 11.2 oz), SpO2 98.00%. Patient Vitals for the past 24 hrs:  BP Temp Temp src Pulse Resp SpO2  08/06/11 0445 116/77 mmHg 97.5 F (36.4 C) Oral 57  18  98 %  08/05/11 2307 99/63 mmHg 98.3 F (36.8 C) Oral 60  20  97 %  08/05/11 1400 111/71 mmHg 97.8 F (36.6 C) Oral 62  18  98 %      Investigations:  CLOSTRIDIUM DIFFICILE BY PCR     Status: Abnormal   Collection Time   08/04/11  6:54 PM      Component Value Range Status Comment   C difficile by pcr POSITIVE (*) NEGATIVE  Final      Basic Metabolic Panel:  Basename 08/06/11 0605 08/04/11 2336 08/04/11 0227  NA 137 136 --  K 3.9 3.6 --  CL 102 103 --  CO2 24 25 --  GLUCOSE 89 110* --  BUN 7 4* --  CREATININE 0.71 0.63 --  CALCIUM 9.1 8.7 --  MG -- -- 1.8  PHOS -- -- --   Liver Function Tests:  [facility] 08/04/11 2336 08/03/11 2336  AST 12 15  ALT 6 7  ALKPHOS 58 76  BILITOT 0.4 0.8  PROT 5.9* 7.3  ALBUMIN 3.0* 3.8   CBC:  Basename 08/06/11 0605 08/04/11 2336 08/03/11 2336  WBC 4.0 3.0* --  NEUTROABS -- -- 6.6  HGB 12.4 10.9* --  HCT 36.1 31.6* --  MCV 80.8 80.6 --  PLT 155 134* --    Ct Abdomen Pelvis W Contrast  08/04/2011  *RADIOLOGY REPORT*  Clinical Data: Abdominal pain  CT ABDOMEN AND PELVIS WITH CONTRAST   IMPRESSION: Splenic  enlargement. Small hepatic cysts. Septated low attenuation mass adjacent to the uterus, suspect complicated cystic ovarian mass; recommend characterization by transabdominal and transvaginal sonography of the pelvis.  Original Report   Dg Abd Acute W/chest  08/04/2011  *RADIOLOGY REPORT*  Clinical Data:  Nausea, vomiting, mid and lower back pain, abdominal pain, diarrhea  ACUTE ABDOMEN SERIES (ABDOMEN 2 VIEW & CHEST 1 VIEW)   IMPRESSION: No acute abnormalities.  Original Report Authenticated By: Lollie Marrow, M.D.   Medications:  Scheduled:    . clonazePAM  0.5 mg Oral QHS  . DULoxetine  60 mg Oral Daily  . metroNIDAZOLE  500 mg Oral Q8H  . ondansetron  8 mg Oral TID AC  . sodium chloride  3 mL Intravenous Q12H  . DISCONTD: potassium chloride  40 mEq Oral BID   Continuous:    . dextrose 5 % and 0.9 % NaCl with KCl 40 mEq/L 20 mL/hr at 08/05/11 1929   JXB:[id number], metoCLOPramide (REGLAN) injection, oxyCODONE-acetaminophen, DISCONTD: ondansetron (ZOFRAN) IV, DISCONTD: ondansetron  Impression:  Principal Problem:  *Nausea and vomiting Active Problems:  Abdominal pain of  unknown etiology  Hypokalemia  Dehydration  Ovarian cystic mass  Hyponatremia  Back pain  Scoliosis     Plan:.   C-diff +.  10 stools over night. Started on Flagyl 3/25, will change to vanc po  (3/26) due to nausea.   Ovarian Cyst.  Transvaginal U/S completed.  Possible retracted clot.  Hemorrhagic cyst. I spoke with [name], GYN.  Who recommended pain control with medications and a follow up in 2 weeks to ensure clot is resolving and no torsion has occurred.  [name] is happy to see the patient in house if needed.    Mild Pan-cytopenia.  Check smear. Improved on 3/26 CBC.  Pain control.  Will start oral dilaudid (percocet make the patient itch) and titrate medication to ensure her pain is controlled before discharge.  Weight loss.  To be worked up as an out patient at [facility], where  her primary care provider is located.  Patient anxious for discharge as she is very financially conscious and her insurance covers her providers at [facility] where she is employed.  Disposition:  d/c 3/27 if pain and diarrhea are manageable with oral medications.   LOS: 3 days   Conley Canal [phone] 08/06/2011, 10:42 AM

## 2011-08-06 NOTE — Progress Notes (Signed)
I have personally seen and examined Ms. [name]  i agree with PE/AP as per [facility] PA note Karna Abed

## 2011-08-07 MED ORDER — HYDROMORPHONE HCL 4 MG PO TABS: 4.0000 mg | ORAL_TABLET | ORAL | Status: DC | PRN

## 2011-08-07 MED ORDER — VANCOMYCIN 50 MG/ML ORAL SOLUTION: 125.0000 mg | Freq: Four times a day (QID) | ORAL | Status: DC

## 2011-08-07 NOTE — Progress Notes (Signed)
[name] to be D/C'd Home per MD order.  Discussed prescriptions and follow up appointments with the patient. Prescriptions given to patient, medication list explained in detail. Pt verbalized understanding.   [name], [name]  Home Medication Instructions ZOX:[id number]   Printed on:08/07/11 1154  Medication Information                    DULoxetine (CYMBALTA) 60 MG capsule Take 60 mg by mouth daily.           clonazePAM (KLONOPIN) 0.5 MG tablet Take 0.5 mg by mouth at bedtime as needed. For sleep           ondansetron (ZOFRAN) 8 MG tablet Take 8 mg by mouth every 8 (eight) hours as needed. For nausea           HYDROmorphone (DILAUDID) 4 MG tablet Take 1 tablet (4 mg total) by mouth every 3 (three) hours as needed.           vancomycin (VANCOCIN) 50 mg/mL oral solution Take 2.5 mLs (125 mg total) by mouth every 6 (six) hours.             Filed Vitals:   08/07/11 0446  BP: 105/69  Pulse: 58  Temp: 98.1 F (36.7 C)  Resp: 16    Skin clean, dry and intact without evidence of skin break down, no evidence of skin tears noted. IV catheter discontinued intact. Site without signs and symptoms of complications. Dressing and pressure applied. Pt denies pain at this time. No complaints noted.  An After Visit Summary was printed and given to the patient. Patient escorted via WC, and D/C home via private auto.  Driggers, Rae Roam 08/07/2011 11:54 AM

## 2011-08-07 NOTE — Discharge Summary (Signed)
I agree with the assessment and plan as outlined by Ms [place]. Patient's diarrhea is significantly better and will follow up with her PCP at [facility] regarding the possible ovarian cystic lesion.

## 2011-08-07 NOTE — Progress Notes (Signed)
   CARE MANAGEMENT NOTE 08/07/2011  Patient:  [name], [name]   Account Number:  [id number]  Date Initiated:  08/06/2011  Documentation initiated by:  Donn Pierini  Subjective/Objective Assessment:   Pt admitted with N/V abd pain---+ c-diff     Action/Plan:   PTA pt lived at home with spouse, was independent with ADLs   Anticipated DC Date:  08/07/2011   Anticipated DC Plan:  HOME/SELF CARE      DC Planning Services  CM consult      Choice offered to / List presented to:             Status of service:  Completed, signed off Medicare Important Message given?   (If response is "NO", the following Medicare IM given date fields will be blank) Date Medicare IM given:   Date Additional Medicare IM given:    Discharge Disposition:  HOME/SELF CARE  Per UR Regulation:  Reviewed for med. necessity/level of care/duration of stay  If discussed at Long Length of Stay Meetings, dates discussed:    Comments:  PCPOlin Hauser  08/07/10- 1045- Donn Pierini RN, BSN [phone] Per benefits check insurance needed mail order number or NDC# to look up med and cost- did not no either of these to give to them- called [facility] and [facility] per pt request and neither of these pharmacies carry the oral solution of vancomycin. [facility] who does carry the oral solution of vanc. to check cost for pt.- per conversation with [facility] out of pocket cost would be $95.71- pt's cost most likely would be less with insurance- info of all this given to pt. Pt informed that if she wants to use [facility] they do need 48min-1hr to prepare drug. Pt to f/u- would like to check a few other pharmacies closer to home. CM available if any further assistance is needed. Address and phone # for [facility] provided to Pt.  08/06/11- 1625- Donn Pierini RN, BSN [phone] Spoke with pt regarding d/c needs- pt do go home on oral solution vancomycin- benefits check in progress. Pt states that she  uses [facility] or [facility]- will check to see if they carry drug needed. If not [facility] or [facility] can be used. CM to follow up when benefits check returns

## 2011-08-07 NOTE — Discharge Summary (Signed)
Patient ID: [name] MRN: [id number] DOB/AGE: 41-15-1972 41 y.o.  Admit date: 08/03/2011 Discharge date: 08/07/2011  Primary Care Physician:  No primary provider on file.  Discharge Diagnoses:   Present on Admission  Hemorrhagic ovarian cystic mass C-Difficile Colitis  Principal Problem:  *Nausea and vomiting Active Problems:  Hypokalemia  Dehydration  Hyponatremia  Back pain  Scoliosis   Medication List  As of 08/07/2011 11:14 AM   TAKE these medications         clonazePAM 0.5 MG tablet   Commonly known as: KLONOPIN   Take 0.5 mg by mouth at bedtime as needed. For sleep      DULoxetine 60 MG capsule   Commonly known as: CYMBALTA   Take 60 mg by mouth daily.      HYDROmorphone 4 MG tablet   Commonly known as: DILAUDID   Take 1 tablet (4 mg total) by mouth every 3 (three) hours as needed.      ondansetron 8 MG tablet   Commonly known as: ZOFRAN   Take 8 mg by mouth every 8 (eight) hours as needed. For nausea      vancomycin 50 mg/mL oral solution   Commonly known as: VANCOCIN   Take 2.5 mLs (125 mg total) by mouth every 6 (six) hours.            Brief H and P: From the admission note:  This is a 41 one year old female who has been in and out of the hospital with similar complaints. She was at [facility] also in December with similar problem. Her main problem has apparently been nausea vomiting and abdominal pain which has persisted. 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 This was associated with the nausea vomiting and she had workup here including upper G. I endoscopy. Patient return now with another episode. She is scheduled to have a colonoscopy at [facility] coming up these April 29. She denies melena or bright red blood per rectum. Patient's problem started with diarrhea a day ago this was followed by the intractable nausea vomiting and flank pain. Hot pain is now relieved with some fentanyl however she is feeling  miserable and not able to keep anything down. Pain is in the epigastric region and is rated a 10 out of 10 radiating to the back no dysuria no fever no diaphoresis.  Hospital Course:   Ms. 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 She presented with nausea vomiting diarrhea and abdominal pain.  She was admitted, received gentle IV hydration, stool was checked for C. difficile colitis and found to be positive.  She was started on oral Flagyl.   The patient also complained of insomnia, diaphoresis, depression and a 60 pound unintentional weight loss in the past year.  Labs in general appeared to be within normal limits. Cortisol level was checked and found to be within normal limits.  A CT of her abdomen and pelvis indicated that she had an enlarged spleen as well as a septated mass on her ovary. Transvaginal ultrasound was done.  Detailed results are listed below but in short a 4 cm hemorrhagic cyst was found on her left ovary.    The patient's nausea vomiting and diarrhea improved significantly, but her nausea continued. Her medication was changed from Flagyl to oral vancomycin and her nausea  stopped.  Diarrhea episodes are down to 2 in the past 24 hours.    With regards to the hemorrhagic ovarian cyst, Dr. Carrington Clamp, gynecology, was consults it via the telephone. Dr. Henderson Cloud felt that the combination of the large ovarian cyst and the patient's C. difficile colitis was causing her abdominal pain. She strongly recommended that the patient have gynecological followup within 2 weeks to ensure that the hemorrhagic cyst was resolving and that no ovarian torsion had occurred. The patient understands this recommendation and will schedule followup with her gynecologist in the [facility] system so that it will be covered by her insurance.  Further the patient  understands that the hospitalist physician here at [facility] recommends outpatient followup within the next 2 weeks for her symptoms of unintentional weight loss, depression, and insomnia, and to insure that her C. difficile colitis has resolved.  Today, the patient will be discharged to home with pain medication ( Dilaudid 4 mg 40 tablets with no refills) and oral vancomycin solution 42 doses (125 mg 4 times a day) to be taken every the next 10 days.  Physical Exam on Discharge: General: Alert, awake, oriented x3, in no acute distress. HEENT: No bruits, no goiter. Heart: Regular rate and rhythm, without murmurs, rubs, gallops. Lungs: Clear to auscultation bilaterally. Abdomen: Soft, tender to palpation in the LLQ, nondistended, positive bowel sounds. Extremities: No clubbing cyanosis or edema with positive pedal pulses. Neuro: Grossly intact, nonfocal.  Filed Vitals:   08/06/11 0445 08/06/11 1500 08/06/11 2043 08/07/11 0446  BP: 116/77 119/76 106/69 105/69  Pulse: 57 56 69 58  Temp: 97.5 F (36.4 C) 97.9 F (36.6 C) 98.2 F (36.8 C) 98.1 F (36.7 C)  TempSrc: Oral  Oral Oral  Resp: 18 18 20 16   Height:      Weight:      SpO2: 98% 95% 97% 96%     Intake/Output Summary (Last 24 hours) at 08/07/11 1114 Last data filed at 08/07/11 0912  Gross per 24 hour  Intake    843 ml  Output      0 ml  Net    843 ml    Basic Metabolic Panel:  Lab 08/06/11 1610 08/04/11 2336 08/04/11 0227  NA 137 136 --  K 3.9 3.6 --  CL 102 103 --  CO2 24 25 --  GLUCOSE 89 110* --  BUN 7 4* --  CREATININE 0.71 0.63 --  CALCIUM 9.1 8.7 --  MG -- -- 1.8  PHOS -- -- --   Liver Function Tests:  Lab 08/04/11 2336 08/03/11 2336  AST 12 15  ALT 6 7  ALKPHOS 58 76  BILITOT 0.4 0.8  PROT 5.9* 7.3  ALBUMIN 3.0* 3.8    Lab 08/03/11 2336  LIPASE 59  AMYLASE --   CBC:  Lab 08/06/11 0605 08/04/11 2336 08/03/11 2336  WBC 4.0 3.0* --  NEUTROABS -- -- 6.6  HGB 12.4 10.9* --  HCT 36.1 31.6* --    MCV 80.8 80.6 --  PLT 155 134* --   D-Dimer:  Lab 08/04/11 0800  DDIMER 1.11*   Thyroid Function Tests:  Lab 08/04/11 0800  TSH 1.752  T4TOTAL --  FREET4 --  T3FREE --  THYROIDAB --   Coagulation:  Lab 08/04/11 0800 08/04/11 0024  LABPROT 15.0 14.5  INR 1.16 1.11     Significant Diagnostic Studies:  US Transvaginal Non-ob  08/05/2011  *RADIOLOGY REPORT*  Clinical Data: Prior hysterectomy remotely.  One ovary was removed  at that time secondary to endometriosis.  The patient does not recall which ovary was resected.  TRANSABDOMINAL AND TRANSVAGINAL ULTRASOUND OF PELVIS Technique:  Both transabdominal and transvaginal ultrasound examinations of the pelvis were performed. Transabdominal technique was performed for global imaging of the pelvis including uterus, ovaries, adnexal regions, and pelvic cul-de-sac.  Comparison: The CT scan from 08/04/2011.   It was necessary to proceed with endovaginal exam following the transabdominal exam to visualize the left ovary.  Findings:  Uterus: Surgically absent  Endometrium: N/A  Right ovary:  Not visualized, likely surgically absent   Left ovary: 6.4 x 4.4 x 6.7 cm.  Within the left ovarian parenchyma, a 4.1 x 3.2 x 5.6 cm complex cystic lesion is identified.  This has a round internal structure showing ultrasound features highly suggestive of retracted clot.  As such, imaging features are felt to be most likely related to a hemorrhagic cyst. However, color Doppler imaging shows apparent flow signal peripherally within the apparent blood clot. While this apparent Doppler signal is probably artifactual, follow-up is recommended.  Other findings: Tiny amount of free fluid is seen in the left adnexal region  IMPRESSION: 5.6 cm complex cystic lesion in the left ovary has indeterminate, but probably benign, characteristics.  Follow-up ultrasound in 6-12 weeks is recommended to ensure resolution. This recommendation follows the consensus statement:   Management of Asymptomatic Ovarian and Other Adnexal Cysts Imaged at [place]:  Society of Radiologists in Ultrasound Consensus Conference Statement. Radiology 2010; [phone].  .  Original Report Authenticated By: ERIC A. MANSELL, M.D.   US Pelvis Complete  08/05/2011  *RADIOLOGY REPORT*  Clinical Data: Prior hysterectomy remotely.  One ovary was removed at that time secondary to endometriosis.  The patient does not recall which ovary was resected.  TRANSABDOMINAL AND TRANSVAGINAL ULTRASOUND OF PELVIS Technique:  Both transabdominal and transvaginal ultrasound examinations of the pelvis were performed. Transabdominal technique was performed for global imaging of the pelvis including uterus, ovaries, adnexal regions, and pelvic cul-de-sac.  Comparison: The CT scan from 08/04/2011.   It was necessary to proceed with endovaginal exam following the transabdominal exam to visualize the left ovary.  Findings:  Uterus: Surgically absent  Endometrium: N/A  Right ovary:  Not visualized, likely surgically absent   Left ovary: 6.4 x 4.4 x 6.7 cm.  Within the left ovarian parenchyma, a 4.1 x 3.2 x 5.6 cm complex cystic lesion is identified.  This has a round internal structure showing ultrasound features highly suggestive of retracted clot.  As such, imaging features are felt to be most likely related to a hemorrhagic cyst. However, color Doppler imaging shows apparent flow signal peripherally within the apparent blood clot. While this apparent Doppler signal is probably artifactual, follow-up is recommended.  Other findings: Tiny amount of free fluid is seen in the left adnexal region  IMPRESSION: 5.6 cm complex cystic lesion in the left ovary has indeterminate, but probably benign, characteristics.  Follow-up ultrasound in 6-12 weeks is recommended to ensure resolution. This recommendation follows the consensus statement:  Management of Asymptomatic Ovarian and Other Adnexal Cysts Imaged at [place]:  Society of Radiologists in  Ultrasound Consensus Conference Statement. Radiology 2010; [phone].  .  Original Report Authenticated By: ERIC A. MANSELL, M.D.   Ct Abdomen Pelvis W Contrast  08/04/2011  *RADIOLOGY REPORT*  Clinical Data: Abdominal pain  CT ABDOMEN AND PELVIS WITH CONTRAST  Technique:  Multidetector CT imaging of the abdomen and pelvis was performed following the standard protocol during bolus administration of  intravenous contrast. Sagittal and coronal MPR images reconstructed from axial data set.  Contrast:  Dilute oral contrast. 100 ml Omnipaque 300 IV.  Comparison: 08/22/2012CT abdomen, 08/01/2010 CT abdomen pelvis  Findings: Minimal dependent atelectasis at lung bases. Gallbladder surgically absent. Small hepatic cysts. Splenic enlargement. No additional focal abnormalities of liver, spleen, pancreas, kidneys, or adrenal glands identified. Normal appendix.  Low lying cecum in the right pelvis. Unremarkable bladder and uterus. Septated mass superior posterior to the uterus, 6.0 x 5.9 x 5.2 cm in size likely ovarian origin though uncertain if left versus right, favor left. No additional masses, adenopathy, free fluid or hernia. Stomach and bowel loops grossly unremarkable, though colon is underdistended and inadequately opacified by contrast rendering assessment of wall thickness suboptimal. No acute osseous findings.  IMPRESSION: Splenic enlargement. Small hepatic cysts. Septated low attenuation mass adjacent to the uterus, suspect complicated cystic ovarian mass; recommend characterization by transabdominal and transvaginal sonography of the pelvis.  Original Report Authenticated By: Lollie Marrow, M.D.   Dg Abd Acute W/chest  08/04/2011  *RADIOLOGY REPORT*  Clinical Data:  Nausea, vomiting, mid and lower back pain, abdominal pain, diarrhea  ACUTE ABDOMEN SERIES (ABDOMEN 2 VIEW & CHEST 1 VIEW)  IMPRESSION: No acute abnormalities.  Original Report Authenticated By: Lollie Marrow, M.D.    Disposition and Follow-up:  Stable for discharge to home with follow up by her regular doctors in the [facility].  Discharge Orders    Future Orders Please Complete By Expires   Diet - low sodium heart healthy      Increase activity slowly        Follow-up Information    Please follow up. (Please see you GYN physician within 2 weeks for Ovarian Cyst follow up.)       Please follow up. (Please see your primary care physician within 2 weeks for hospital follow up.)           Time spent on Discharge: 25 min.  SignedStephani Police 08/07/2011, 11:14 AM [phone]

## 2011-08-12 ENCOUNTER — Emergency Department (HOSPITAL_COMMUNITY): Payer: PRIVATE HEALTH INSURANCE

## 2011-08-12 ENCOUNTER — Encounter (HOSPITAL_COMMUNITY): Payer: Self-pay

## 2011-08-12 ENCOUNTER — Inpatient Hospital Stay (HOSPITAL_COMMUNITY)
Admission: EM | Admit: 2011-08-12 | Discharge: 2011-08-14 | DRG: 392 | Disposition: A | Discharge status: Other Acute Inpatient Hospital | Payer: PRIVATE HEALTH INSURANCE | Attending: Internal Medicine | Admitting: Internal Medicine

## 2011-08-12 DIAGNOSIS — Z888 Allergy status to other drugs, medicaments and biological substances status: Secondary | ICD-10-CM

## 2011-08-12 DIAGNOSIS — F17200 Nicotine dependence, unspecified, uncomplicated: Secondary | ICD-10-CM | POA: Diagnosis present

## 2011-08-12 DIAGNOSIS — E871 Hypo-osmolality and hyponatremia: Secondary | ICD-10-CM

## 2011-08-12 DIAGNOSIS — R197 Diarrhea, unspecified: Secondary | ICD-10-CM | POA: Diagnosis present

## 2011-08-12 DIAGNOSIS — F3289 Other specified depressive episodes: Secondary | ICD-10-CM | POA: Diagnosis present

## 2011-08-12 DIAGNOSIS — Z90712 Acquired absence of cervix with remaining uterus: Secondary | ICD-10-CM

## 2011-08-12 DIAGNOSIS — F329 Major depressive disorder, single episode, unspecified: Secondary | ICD-10-CM | POA: Diagnosis present

## 2011-08-12 DIAGNOSIS — E876 Hypokalemia: Secondary | ICD-10-CM

## 2011-08-12 DIAGNOSIS — M419 Scoliosis, unspecified: Secondary | ICD-10-CM

## 2011-08-12 DIAGNOSIS — M549 Dorsalgia, unspecified: Secondary | ICD-10-CM

## 2011-08-12 DIAGNOSIS — R112 Nausea with vomiting, unspecified: Secondary | ICD-10-CM | POA: Diagnosis present

## 2011-08-12 DIAGNOSIS — N83209 Unspecified ovarian cyst, unspecified side: Secondary | ICD-10-CM | POA: Diagnosis present

## 2011-08-12 DIAGNOSIS — E860 Dehydration: Secondary | ICD-10-CM | POA: Diagnosis present

## 2011-08-12 DIAGNOSIS — R109 Unspecified abdominal pain: Principal | ICD-10-CM | POA: Diagnosis present

## 2011-08-12 HISTORY — DX: Disease of blood and blood-forming organs, unspecified: D75.9

## 2011-08-12 HISTORY — DX: Unspecified ovarian cyst, unspecified side: N83.209

## 2011-08-12 LAB — CBC
MCH: 28.8 pg (ref 26.0–34.0)
MCH: 27.9 pg (ref 26.0–34.0)
MCHC: 36.1 g/dL — ABNORMAL HIGH (ref 30.0–36.0)
Platelets: 214 10*3/uL (ref 150–400)
Platelets: 192 10*3/uL (ref 150–400)
RBC: 4.59 MIL/uL (ref 3.87–5.11)
WBC: 4.8 10*3/uL (ref 4.0–10.5)

## 2011-08-12 LAB — DIFFERENTIAL
Basophils Relative: 0 % (ref 0–1)
Eosinophils Absolute: 0 10*3/uL (ref 0.0–0.7)
Eosinophils Absolute: 0.1 10*3/uL (ref 0.0–0.7)
Lymphocytes Relative: 32 % (ref 12–46)
Lymphs Abs: 1.6 10*3/uL (ref 0.7–4.0)
Neutrophils Relative %: 73 % (ref 43–77)
Neutrophils Relative %: 57 % (ref 43–77)

## 2011-08-12 LAB — URINE MICROSCOPIC-ADD ON

## 2011-08-12 LAB — COMPREHENSIVE METABOLIC PANEL
ALT: 10 U/L (ref 0–35)
Albumin: 4.6 g/dL (ref 3.5–5.2)
Alkaline Phosphatase: 83 U/L (ref 39–117)
BUN: 8 mg/dL (ref 6–23)
Potassium: 3.5 mEq/L (ref 3.5–5.1)
Sodium: 136 mEq/L (ref 135–145)
Total Protein: 8.8 g/dL — ABNORMAL HIGH (ref 6.0–8.3)

## 2011-08-12 LAB — LIPASE, BLOOD: Lipase: 35 U/L (ref 11–59)

## 2011-08-12 LAB — URINALYSIS, ROUTINE W REFLEX MICROSCOPIC
Bilirubin Urine: NEGATIVE
Nitrite: NEGATIVE
Specific Gravity, Urine: 1.019 (ref 1.005–1.030)
pH: 8 (ref 5.0–8.0)

## 2011-08-12 MED ORDER — METRONIDAZOLE IN NACL 5-0.79 MG/ML-% IV SOLN
500.0000 mg | Freq: Three times a day (TID) | INTRAVENOUS | Status: DC
  Administered 2011-08-13 – 2011-08-14 (×5): 500 mg via INTRAVENOUS
  Filled 2011-08-12 (×7): qty 100

## 2011-08-12 MED ORDER — ONDANSETRON HCL 4 MG/2ML IJ SOLN
INTRAMUSCULAR | Status: AC
  Filled 2011-08-12: qty 2

## 2011-08-12 MED ORDER — SODIUM CHLORIDE 0.9 % IV SOLN
INTRAVENOUS | Status: DC
  Administered 2011-08-13 – 2011-08-14 (×5): via INTRAVENOUS

## 2011-08-12 MED ORDER — DULOXETINE HCL 60 MG PO CPEP
60.0000 mg | ORAL_CAPSULE | Freq: Every day | ORAL | Status: DC
  Administered 2011-08-13 – 2011-08-14 (×2): 60 mg via ORAL
  Filled 2011-08-12 (×2): qty 1

## 2011-08-12 MED ORDER — ACETAMINOPHEN 325 MG PO TABS: 650.0000 mg | ORAL_TABLET | Freq: Four times a day (QID) | ORAL | Status: DC | PRN

## 2011-08-12 MED ORDER — ONDANSETRON HCL 4 MG/2ML IJ SOLN
4.0000 mg | Freq: Four times a day (QID) | INTRAMUSCULAR | Status: DC | PRN
  Administered 2011-08-13 – 2011-08-14 (×3): 4 mg via INTRAVENOUS
  Filled 2011-08-12 (×3): qty 2

## 2011-08-12 MED ORDER — ONDANSETRON HCL 4 MG PO TABS
4.0000 mg | ORAL_TABLET | Freq: Four times a day (QID) | ORAL | Status: DC | PRN
  Administered 2011-08-13: 4 mg via ORAL
  Filled 2011-08-12: qty 1

## 2011-08-12 MED ORDER — HYDROMORPHONE HCL PF 1 MG/ML IJ SOLN
2.0000 mg | Freq: Once | INTRAMUSCULAR | Status: AC
  Administered 2011-08-12: 2 mg via INTRAVENOUS
  Filled 2011-08-12: qty 2

## 2011-08-12 MED ORDER — ACETAMINOPHEN 650 MG RE SUPP: 650.0000 mg | Freq: Four times a day (QID) | RECTAL | Status: DC | PRN

## 2011-08-12 MED ORDER — DIPHENHYDRAMINE HCL 50 MG/ML IJ SOLN
25.0000 mg | Freq: Once | INTRAMUSCULAR | Status: AC
  Administered 2011-08-12: 25 mg via INTRAVENOUS
  Filled 2011-08-12: qty 1

## 2011-08-12 MED ORDER — VANCOMYCIN 50 MG/ML ORAL SOLUTION
125.0000 mg | Freq: Four times a day (QID) | ORAL | Status: DC
  Administered 2011-08-13 – 2011-08-14 (×6): 125 mg via ORAL
  Filled 2011-08-12 (×10): qty 2.5

## 2011-08-12 MED ORDER — METOCLOPRAMIDE HCL 5 MG/ML IJ SOLN
10.0000 mg | Freq: Once | INTRAMUSCULAR | Status: AC
  Administered 2011-08-12: 10 mg via INTRAVENOUS
  Filled 2011-08-12: qty 2

## 2011-08-12 MED ORDER — CLONAZEPAM 0.5 MG PO TABS
0.5000 mg | ORAL_TABLET | Freq: Every evening | ORAL | Status: DC | PRN
  Administered 2011-08-13: 0.5 mg via ORAL
  Filled 2011-08-12: qty 1

## 2011-08-12 MED ORDER — LORAZEPAM 2 MG/ML IJ SOLN
0.5000 mg | Freq: Once | INTRAMUSCULAR | Status: AC
  Administered 2011-08-12: 2 mg via INTRAVENOUS
  Filled 2011-08-12: qty 1

## 2011-08-12 MED ORDER — ONDANSETRON HCL 4 MG/2ML IJ SOLN
4.0000 mg | Freq: Once | INTRAMUSCULAR | Status: AC
  Administered 2011-08-12: 4 mg via INTRAVENOUS

## 2011-08-12 MED ORDER — SODIUM CHLORIDE 0.9 % IV SOLN
1000.0000 mL | Freq: Once | INTRAVENOUS | Status: AC
  Administered 2011-08-12 (×2): 1000 mL via INTRAVENOUS

## 2011-08-12 MED ORDER — HYDROMORPHONE HCL PF 1 MG/ML IJ SOLN
1.0000 mg | INTRAMUSCULAR | Status: DC | PRN
  Administered 2011-08-13 (×3): 1 mg via INTRAVENOUS
  Filled 2011-08-12 (×3): qty 1

## 2011-08-12 NOTE — ED Notes (Signed)
Patient transported to [id number].

## 2011-08-12 NOTE — ED Provider Notes (Signed)
History     CSN: [id number]  Arrival date & time 08/12/11  1408   First MD Initiated Contact with Patient 08/12/11 1632      Chief Complaint  Patient presents with  . Nausea  . Emesis  . Diarrhea    (Consider location/radiation/quality/duration/timing/severity/associated sxs/prior treatment) HPI CC diffuse abd pain associated with n/v/d.  This is a recurrent problem and has been present for about 1 year.  She has seen several specialists at [facility], including a rheumatologist and gastroenterologist.  She has had an upper egd which showed no etiology for her sx's.  She is scheduled for a colonoscopy 09/03/11.  She was recently admitted here for intractable n/v, cdiff was pos and was placed on oral vancomycin which she has not been able to hold down for the past 3 days.  Sx are moderate to severe, worse after eating.  abd pain is dull and diffuse.  Past Medical History  Diagnosis Date  . S/P laparoscopic cholecystectomy   . Clotting disorder   . Depression   . Anxiety   . Blood dyscrasia     hx of clotting disorder  . Ovarian cyst     Past Surgical History  Procedure Date  . Abdominal hysterectomy   . Cholecystectomy   . Ceasarean     History reviewed. No pertinent family history.  History  Substance Use Topics  . Smoking status: Current Everyday Smoker -- 2.0 packs/day for 20 years    Types: Cigarettes  . Smokeless tobacco: Never Used  . Alcohol Use: No    OB History    Grav Para Term Preterm Abortions TAB SAB Ect Mult Living                  Review of Systems  Constitutional: Negative for fever and chills.  Gastrointestinal: Positive for nausea, vomiting, abdominal pain and diarrhea.  Genitourinary: Negative for dysuria and frequency.  All other systems reviewed and are negative.    Allergies  Phenergan  Home Medications   No current outpatient prescriptions on file.  BP 100/64  Pulse 64  Temp(Src) 98.3 F (36.8 C) (Oral)  Resp 16  Ht 5\' 8"  (1.727  m)  Wt 146 lb 13.2 oz (66.6 kg)  BMI 22.32 kg/m2  SpO2 98%  Physical Exam  Nursing note and vitals reviewed. Constitutional: She appears well-developed and well-nourished.  HENT:  Head: Normocephalic and atraumatic.  Eyes: Right eye exhibits no discharge. Left eye exhibits no discharge.  Neck: Normal range of motion. Neck supple.  Cardiovascular: Normal rate, regular rhythm and normal heart sounds.   Pulmonary/Chest: Effort normal and breath sounds normal.  Abdominal: Soft. Bowel sounds are normal. She exhibits no distension. There is tenderness (diffusely).  Musculoskeletal: She exhibits no edema and no tenderness.  Neurological: She is alert. GCS eye subscore is 4. GCS verbal subscore is 5. GCS motor subscore is 6.  Skin: Skin is warm and dry.  Psychiatric: She has a normal mood and affect. Her behavior is normal.    ED Course  Procedures (including critical care time)  Labs Reviewed  CBC - Abnormal; Notable for the following:    RBC 5.59 (*)    Hemoglobin 16.1 (*)    MCHC 36.1 (*)    All other components within normal limits  COMPREHENSIVE METABOLIC PANEL - Abnormal; Notable for the following:    Glucose, Bld 114 (*)    Calcium 10.7 (*)    Total Protein 8.8 (*)    All other  components within normal limits  URINALYSIS, ROUTINE W REFLEX MICROSCOPIC - Abnormal; Notable for the following:    Ketones, ur 15 (*)    Protein, ur 30 (*)    Leukocytes, UA TRACE (*)    All other components within normal limits  URINE MICROSCOPIC-ADD ON - Abnormal; Notable for the following:    Casts HYALINE CASTS (*)    All other components within normal limits  COMPREHENSIVE METABOLIC PANEL - Abnormal; Notable for the following:    Potassium 3.3 (*)    Albumin 3.2 (*)    All other components within normal limits  GLUCOSE, CAPILLARY - Abnormal; Notable for the following:    Glucose-Capillary 105 (*)    All other components within normal limits  GLUCOSE, CAPILLARY - Abnormal; Notable for the  following:    Glucose-Capillary 103 (*)    All other components within normal limits  GLUCOSE, CAPILLARY - Abnormal; Notable for the following:    Glucose-Capillary 121 (*)    All other components within normal limits  GLUCOSE, CAPILLARY - Abnormal; Notable for the following:    Glucose-Capillary 127 (*)    All other components within normal limits  DIFFERENTIAL  LIPASE, BLOOD  CLOSTRIDIUM DIFFICILE BY PCR  LIPASE, BLOOD  CBC  DIFFERENTIAL  MAGNESIUM  PARATHYROID HORMONE, INTACT (NO CA)  CLOSTRIDIUM DIFFICILE BY PCR  CBC  BASIC METABOLIC PANEL   US Transvaginal Non-ob  08/12/2011  *RADIOLOGY REPORT*  Clinical Data:  Left-sided pelvic pain for 1 week.  Status post partial hysterectomy and right oophorectomy.  TRANSABDOMINAL AND TRANSVAGINAL ULTRASOUND OF PELVIS DOPPLER ULTRASOUND OF OVARIES  Technique:  Both transabdominal and transvaginal ultrasound examinations of the pelvis were performed. Transabdominal technique was performed for global imaging of the pelvis including uterus, ovaries, adnexal regions, and pelvic cul-de-sac.  It was necessary to proceed with endovaginal exam following the transabdominal exam to visualize the left ovary and cervix.  Color and duplex Doppler ultrasound was utilized to evaluate blood flow to the ovaries.  Comparison:  08/05/2011  Findings:  Uterus:  Partial hysterectomy.  The cervix is visualized and demonstrates cystic changes consistent with Nabothian cysts.  Endometrium:  Surgically absent  Right ovary: Surgically absent  Left ovary:   The left ovary is enlarged, measuring 6.8 x 3.5 x 5.2 cm.  The left ovary contains a complex cystic mass measuring 4.6 x 5.8 x 3.5 cm with a solid appearing nodular component measuring about 2.7 x 3.3 x 2.5 cm. This could represent a solid tissue or retracted clot.  Color flow Doppler images demonstrate minimal flow in the periphery.  The appearance is unchanged since the previous study.  The lesion remains indeterminate and  continued follow-up in six to 12 weeks is recommended.  Pulsed Doppler evaluation demonstrates normal low-resistance arterial and venous waveforms in the left ovary. Normal flow demonstrated within the left ovary on color flow Doppler imaging.  No free pelvic fluid collections.  IMPRESSION: Surgical absence of the uterus with cervix remaining.  Surgical absence of the right ovary.  The left ovary again demonstrates diffuse enlargement with a complex cystic mass.  The mass is stable since the previous study remains indeterminate.  Additional follow- up is recommended at 6-12 weeks.  No sonographic evidence for ovarian torsion.  Original Report Authenticated By: Marlon Pel, M.D.   US Pelvis Complete  08/12/2011  *RADIOLOGY REPORT*  Clinical Data:  Left-sided pelvic pain for 1 week.  Status post partial hysterectomy and right oophorectomy.  TRANSABDOMINAL AND TRANSVAGINAL ULTRASOUND  OF PELVIS DOPPLER ULTRASOUND OF OVARIES  Technique:  Both transabdominal and transvaginal ultrasound examinations of the pelvis were performed. Transabdominal technique was performed for global imaging of the pelvis including uterus, ovaries, adnexal regions, and pelvic cul-de-sac.  It was necessary to proceed with endovaginal exam following the transabdominal exam to visualize the left ovary and cervix.  Color and duplex Doppler ultrasound was utilized to evaluate blood flow to the ovaries.  Comparison:  08/05/2011  Findings:  Uterus:  Partial hysterectomy.  The cervix is visualized and demonstrates cystic changes consistent with Nabothian cysts.  Endometrium:  Surgically absent  Right ovary: Surgically absent  Left ovary:   The left ovary is enlarged, measuring 6.8 x 3.5 x 5.2 cm.  The left ovary contains a complex cystic mass measuring 4.6 x 5.8 x 3.5 cm with a solid appearing nodular component measuring about 2.7 x 3.3 x 2.5 cm. This could represent a solid tissue or retracted clot.  Color flow Doppler images demonstrate  minimal flow in the periphery.  The appearance is unchanged since the previous study.  The lesion remains indeterminate and continued follow-up in six to 12 weeks is recommended.  Pulsed Doppler evaluation demonstrates normal low-resistance arterial and venous waveforms in the left ovary. Normal flow demonstrated within the left ovary on color flow Doppler imaging.  No free pelvic fluid collections.  IMPRESSION: Surgical absence of the uterus with cervix remaining.  Surgical absence of the right ovary.  The left ovary again demonstrates diffuse enlargement with a complex cystic mass.  The mass is stable since the previous study remains indeterminate.  Additional follow- up is recommended at 6-12 weeks.  No sonographic evidence for ovarian torsion.  Original Report Authenticated By: Marlon Pel, M.D.   [place] Art/ven Flow Abd Pelv Doppler  08/12/2011  *RADIOLOGY REPORT*  Clinical Data:  Left-sided pelvic pain for 1 week.  Status post partial hysterectomy and right oophorectomy.  TRANSABDOMINAL AND TRANSVAGINAL ULTRASOUND OF PELVIS DOPPLER ULTRASOUND OF OVARIES  Technique:  Both transabdominal and transvaginal ultrasound examinations of the pelvis were performed. Transabdominal technique was performed for global imaging of the pelvis including uterus, ovaries, adnexal regions, and pelvic cul-de-sac.  It was necessary to proceed with endovaginal exam following the transabdominal exam to visualize the left ovary and cervix.  Color and duplex Doppler ultrasound was utilized to evaluate blood flow to the ovaries.  Comparison:  08/05/2011  Findings:  Uterus:  Partial hysterectomy.  The cervix is visualized and demonstrates cystic changes consistent with Nabothian cysts.  Endometrium:  Surgically absent  Right ovary: Surgically absent  Left ovary:   The left ovary is enlarged, measuring 6.8 x 3.5 x 5.2 cm.  The left ovary contains a complex cystic mass measuring 4.6 x 5.8 x 3.5 cm with a solid appearing nodular  component measuring about 2.7 x 3.3 x 2.5 cm. This could represent a solid tissue or retracted clot.  Color flow Doppler images demonstrate minimal flow in the periphery.  The appearance is unchanged since the previous study.  The lesion remains indeterminate and continued follow-up in six to 12 weeks is recommended.  Pulsed Doppler evaluation demonstrates normal low-resistance arterial and venous waveforms in the left ovary. Normal flow demonstrated within the left ovary on color flow Doppler imaging.  No free pelvic fluid collections.  IMPRESSION: Surgical absence of the uterus with cervix remaining.  Surgical absence of the right ovary.  The left ovary again demonstrates diffuse enlargement with a complex cystic mass.  The mass is  stable since the previous study remains indeterminate.  Additional follow- up is recommended at 6-12 weeks.  No sonographic evidence for ovarian torsion.  Original Report Authenticated By: Marlon Pel, M.D.   Dg Abd Acute W/chest  08/12/2011  *RADIOLOGY REPORT*  Clinical Data: Abdominal pain  ACUTE ABDOMEN SERIES (ABDOMEN 2 VIEW & CHEST 1 VIEW)  Comparison: Prior study 08/04/2011.  Findings: The upright chest x-ray is normal.  Two views of the abdomen demonstrate scattered air throughout the colon and scattered air filled small bowel loops but no distention or air fluid levels.  No free air.  The soft tissue shadows are maintained.  The bony structures are intact.  IMPRESSION:  1.  Normal chest x-ray. 2.  No plain film findings for an acute abdominal process.  Original Report Authenticated By: P. Loralie Champagne, M.D.     1. Nausea vomiting and diarrhea   2. Abdominal pain       MDM  Pt is in nad, afvss, nontoxic appearing, exam and hx as above. Here with chronic abd pain and n/v/d.  On exam pt writhing in bed with even light brushing of the skin of her abd but not with auscultation.  Had ct a/p during last admission which showed hemorrhagic cyst for which she has yet  to obtain f/u for.  Pain is diffuse, not located in the pelvic area and has not changed recently, doubt torsion, appx, gu related.  No distention, pt has had chronic diarrhea, doubt obstruction.  Getting labs.  Results d/w pt and friend, pt states she does not feel comfortable going home.  Internal medicine ocnsulted, will admit for intractable n/v and abd pain.        Elijio Miles, MD 08/13/11 [phone]

## 2011-08-12 NOTE — H&P (Signed)
[name] is an 41 y.o. female.   PCP - Dr.Peyser, Bruce. Chief Complaint: Nausea vomiting and abdominal pain. HPI: 41 year old female who was just recently discharged recently a week ago after being treated for nausea vomiting and diarrhea and abdominal pain, and at that time patient was found to be having C. difficile colitis and left sided ovarian cyst. Patient is discharged home with vancomycin and also was advised to follow with gynecologist at [facility] where he usually follows. At this time patient has come again because of recurrence of her nausea vomiting diarrhea with abdominal pain which started off on Saturday morning, 3 days ago. She has had increased 10 bowel movements and equal number of vomiting episodes. She is unable to keep anything. It started off as a pain in the left lower quadrant which has been persistent and constant and has no relation to food bowel movement. It has gradually become more diffuse. Since the symptoms are persistent patient has come to the ER. Patient has been having these symptoms since last March and has had multiple workup done EGD and is scheduled to have a colonoscopy in [facility] this month.   Past Medical History  Diagnosis Date  . S/P laparoscopic cholecystectomy   . Clotting disorder   . Depression   . Anxiety     Past Surgical History  Procedure Date  . Abdominal hysterectomy   . Cholecystectomy   . Ceasarean     History reviewed. No pertinent family history. Social History:  reports that she has been smoking Cigarettes.  She has a 40 pack-year smoking history. She does not have any smokeless tobacco history on file. She reports that she does not drink alcohol or use illicit drugs.  Allergies:  Allergies  Allergen Reactions  . Phenergan Other (See Comments)    Restless legs    Medications Prior to Admission  Medication Dose Route Frequency Provider Last Rate Last Dose  . 0.9 %  sodium chloride infusion  1,000 mL  Intravenous Once Elijio Miles, MD 1,000 mL/hr at 08/12/11 1937 1,000 mL at 08/12/11 1937  . diphenhydrAMINE (BENADRYL) injection 25 mg  25 mg Intravenous Once Elijio Miles, MD   25 mg at 08/12/11 1705  . HYDROmorphone (DILAUDID) injection 2 mg  2 mg Intravenous Once Elijio Miles, MD   2 mg at 08/12/11 1705  . HYDROmorphone (DILAUDID) injection 2 mg  2 mg Intravenous Once Elijio Miles, MD   2 mg at 08/12/11 1936  . LORazepam (ATIVAN) injection 0.5 mg  0.5 mg Intravenous Once Eduard Clos, MD   2 mg at 08/12/11 2032  . metoCLOPramide (REGLAN) injection 10 mg  10 mg Intravenous Once Elijio Miles, MD   10 mg at 08/12/11 1705  . ondansetron (ZOFRAN) injection 4 mg  4 mg Intravenous Once Elijio Miles, MD   4 mg at 08/12/11 1941   Medications Prior to Admission  Medication Sig Dispense Refill  . clonazePAM (KLONOPIN) 0.5 MG tablet Take 0.5 mg by mouth at bedtime as needed. For sleep      . DULoxetine (CYMBALTA) 60 MG capsule Take 60 mg by mouth daily.      Marland Kitchen HYDROmorphone (DILAUDID) 4 MG tablet Take 4 mg by mouth every 3 (three) hours as needed. For pain.      Marland Kitchen ondansetron (ZOFRAN) 8 MG tablet Take 8 mg by mouth every 8 (eight) hours as needed. For nausea        Results for orders placed during the  hospital encounter of 08/12/11 (from the past 48 hour(s))  CBC     Status: Abnormal   Collection Time   08/12/11  4:53 PM      Component Value Range Comment   WBC 5.7  4.0 - 10.5 (K/uL)    RBC 5.59 (*) 3.87 - 5.11 (MIL/uL)    Hemoglobin 16.1 (*) 12.0 - 15.0 (g/dL)    HCT 62.1  30.8 - 65.7 (%)    MCV 79.8  78.0 - 100.0 (fL)    MCH 28.8  26.0 - 34.0 (pg)    MCHC 36.1 (*) 30.0 - 36.0 (g/dL)    RDW 84.6  96.2 - 95.2 (%)    Platelets 214  150 - 400 (K/uL)   DIFFERENTIAL     Status: Normal   Collection Time   08/12/11  4:53 PM      Component Value Range Comment   Neutrophils Relative 73  43 - 77 (%)    Neutro Abs 4.2  1.7 - 7.7 (K/uL)    Lymphocytes Relative 20  12 - 46 (%)    Lymphs Abs 1.1  0.7 -  4.0 (K/uL)    Monocytes Relative 7  3 - 12 (%)    Monocytes Absolute 0.4  0.1 - 1.0 (K/uL)    Eosinophils Relative 0  0 - 5 (%)    Eosinophils Absolute 0.0  0.0 - 0.7 (K/uL)    Basophils Relative 0  0 - 1 (%)    Basophils Absolute 0.0  0.0 - 0.1 (K/uL)   COMPREHENSIVE METABOLIC PANEL     Status: Abnormal   Collection Time   08/12/11  4:53 PM      Component Value Range Comment   Sodium 136  135 - 145 (mEq/L)    Potassium 3.5  3.5 - 5.1 (mEq/L)    Chloride 96  96 - 112 (mEq/L)    CO2 23  19 - 32 (mEq/L)    Glucose, Bld 114 (*) 70 - 99 (mg/dL)    BUN 8  6 - 23 (mg/dL)    Creatinine, Ser 8.41  0.50 - 1.10 (mg/dL)    Calcium 32.4 (*) 8.4 - 10.5 (mg/dL)    Total Protein 8.8 (*) 6.0 - 8.3 (g/dL)    Albumin 4.6  3.5 - 5.2 (g/dL)    AST 15  0 - 37 (U/L)    ALT 10  0 - 35 (U/L)    Alkaline Phosphatase 83  39 - 117 (U/L)    Total Bilirubin 0.4  0.3 - 1.2 (mg/dL)    GFR calc non Af Amer >90  >90 (mL/min)    GFR calc Af Amer >90  >90 (mL/min)   LIPASE, BLOOD     Status: Normal   Collection Time   08/12/11  4:53 PM      Component Value Range Comment   Lipase 35  11 - 59 (U/L)   URINALYSIS, ROUTINE W REFLEX MICROSCOPIC     Status: Abnormal   Collection Time   08/12/11  6:03 PM      Component Value Range Comment   Color, Urine YELLOW  YELLOW     APPearance CLEAR  CLEAR     Specific Gravity, Urine 1.019  1.005 - 1.030     pH 8.0  5.0 - 8.0     Glucose, UA NEGATIVE  NEGATIVE (mg/dL)    Hgb urine dipstick NEGATIVE  NEGATIVE     Bilirubin Urine NEGATIVE  NEGATIVE     Ketones,  ur 15 (*) NEGATIVE (mg/dL)    Protein, ur 30 (*) NEGATIVE (mg/dL)    Urobilinogen, UA 0.2  0.0 - 1.0 (mg/dL)    Nitrite NEGATIVE  NEGATIVE     Leukocytes, UA TRACE (*) NEGATIVE    URINE MICROSCOPIC-ADD ON     Status: Abnormal   Collection Time   08/12/11  6:03 PM      Component Value Range Comment   Squamous Epithelial / LPF RARE  RARE     WBC, UA 0-2  <3 (WBC/hpf)    RBC / HPF 0-2  <3 (RBC/hpf)    Bacteria, UA RARE   RARE     Casts HYALINE CASTS (*) NEGATIVE     Urine-Other MUCOUS PRESENT      No results found.  Review of Systems  Constitutional: Negative.   HENT: Negative.   Eyes: Negative.   Respiratory: Negative.   Cardiovascular: Negative.   Gastrointestinal: Positive for nausea, vomiting, abdominal pain and diarrhea.  Genitourinary: Negative.   Musculoskeletal: Negative.   Skin: Negative.   Neurological: Negative.   Endo/Heme/Allergies: Negative.   Psychiatric/Behavioral: Negative.     Blood pressure 122/79, pulse 73, temperature 98.2 F (36.8 C), temperature source Oral, resp. rate 16, SpO2 100.00%. Physical Exam  Constitutional: She is oriented to person, place, and time. She appears well-developed and well-nourished. No distress.  HENT:  Head: Normocephalic and atraumatic.  Right Ear: External ear normal.  Left Ear: External ear normal.  Mouth/Throat: No oropharyngeal exudate.  Eyes: Conjunctivae are normal. Pupils are equal, round, and reactive to light. Right eye exhibits no discharge. Left eye exhibits no discharge. No scleral icterus.  Neck: Normal range of motion. Neck supple.  Cardiovascular: Normal rate and regular rhythm.   Respiratory: Effort normal and breath sounds normal. No respiratory distress. She has no wheezes. She has no rales.  GI:       Mild tenderness all over more on the left lower quadrant. No rigidity.  Musculoskeletal: Normal range of motion. She exhibits no edema and no tenderness.  Neurological: She is alert and oriented to person, place, and time.       Moves all extremities.  Skin: Skin is warm and dry. No rash noted. She is not diaphoretic. No erythema.  Psychiatric: Her behavior is normal.     Assessment/Plan #1. Persistent nausea vomiting and abdominal pain and diarrhea - as patient cannot reliably take by mouth and placing patient on Flagyl for C. difficile which was recently diagnosed. Check C. difficile PCR again, check lipase. Since patient  had ovarian cyst I have ordered a Doppler and pelvic ultrasound to make sure there is no torsion. And also check acute abdominal series. If pain persists may need a CAT scan abdomen and pelvis again. At this time patient will be kept n.p.o., With pain relief medications and IV fluids. Check pregnancy screen. #2. Dehydration from #1 reason. #3. Mild hypercalcemia - probably from dehydration which may improve with hydration, recheck metabolic panel in a.m. after hydration. Check PTH. #4. Left ovarian cyst recently diagnosed. #5. History of DIC last year .  At this time patient looks hemodynamically stable and is not in any acute distress so we will manage her in medical floor.  CODE STATUS - full code.  Eduard Clos 08/12/2011, 8:58 PM

## 2011-08-12 NOTE — ED Notes (Signed)
Attempted to call report, RN unable to come to phone.  Will return phone call.

## 2011-08-12 NOTE — ED Notes (Signed)
Pt complains of abd pain  And n/v/d since Saturday night, recent diagnosis of c diff last week.

## 2011-08-12 NOTE — ED Notes (Signed)
0.5 mg ativan given

## 2011-08-13 ENCOUNTER — Encounter (HOSPITAL_COMMUNITY): Payer: Self-pay | Admitting: General Practice

## 2011-08-13 LAB — MAGNESIUM: Magnesium: 2.1 mg/dL (ref 1.5–2.5)

## 2011-08-13 LAB — GLUCOSE, CAPILLARY
Glucose-Capillary: 105 mg/dL — ABNORMAL HIGH (ref 70–99)
Glucose-Capillary: 103 mg/dL — ABNORMAL HIGH (ref 70–99)
Glucose-Capillary: 121 mg/dL — ABNORMAL HIGH (ref 70–99)

## 2011-08-13 LAB — COMPREHENSIVE METABOLIC PANEL
BUN: 9 mg/dL (ref 6–23)
CO2: 26 mEq/L (ref 19–32)
Chloride: 104 mEq/L (ref 96–112)
Creatinine, Ser: 0.67 mg/dL (ref 0.50–1.10)
GFR calc non Af Amer: >90 mL/min (ref >90)
Glucose, Bld: 88 mg/dL (ref 70–99)
Total Bilirubin: 0.3 mg/dL (ref 0.3–1.2)

## 2011-08-13 MED ORDER — HYDROMORPHONE HCL PF 1 MG/ML IJ SOLN
2.0000 mg | INTRAMUSCULAR | Status: DC | PRN
  Administered 2011-08-13 – 2011-08-14 (×4): 2 mg via INTRAVENOUS
  Filled 2011-08-13 (×4): qty 2

## 2011-08-13 MED ORDER — METOCLOPRAMIDE HCL 5 MG/ML IJ SOLN
5.0000 mg | Freq: Three times a day (TID) | INTRAMUSCULAR | Status: DC | PRN
  Administered 2011-08-13: 5 mg via INTRAVENOUS
  Filled 2011-08-13: qty 1

## 2011-08-13 MED ORDER — HYDROMORPHONE HCL PF 1 MG/ML IJ SOLN
1.0000 mg | Freq: Once | INTRAMUSCULAR | Status: AC
  Administered 2011-08-13: 1 mg via INTRAVENOUS
  Filled 2011-08-13: qty 1

## 2011-08-13 MED ORDER — OXYCODONE-ACETAMINOPHEN 5-325 MG PO TABS
1.0000 | ORAL_TABLET | ORAL | Status: DC | PRN
  Administered 2011-08-14 (×2): 2 via ORAL
  Filled 2011-08-13 (×2): qty 2

## 2011-08-13 MED ORDER — POTASSIUM CHLORIDE 10 MEQ/100ML IV SOLN
10.0000 meq | INTRAVENOUS | Status: AC
  Administered 2011-08-13 (×3): 10 meq via INTRAVENOUS
  Filled 2011-08-13 (×3): qty 100

## 2011-08-13 NOTE — Progress Notes (Signed)
Chaplain's Note: I responded to a page from SW.  Spoke with pt and offered emotional support.  Pt misses being able to speak to her pastor.  Pt was tearful and said the she felt hopeless.  We discussed support issues and spiritual issues.  We ended the visit with prayer.  Will continue to follow.  Please page if needed or requested.  08/13/11 1625  Clinical Encounter Type  Visited With Patient  Visit Type Psychological support;Spiritual support  Referral From Social work  Spiritual Encounters  Spiritual Needs Grief support;Emotional;Prayer  Stress Factors  Patient Stress Factors Health changes;Lack of knowledge;Major life changes;Loss of control  Family Stress Factors Not reviewed   Dellie Catholic  [phone]  On call pager

## 2011-08-13 NOTE — ED Provider Notes (Signed)
I saw and evaluated the patient, reviewed the resident's note and I agree with the findings and plan.  Acute on chronic abdominal pain nausea vomiting diarrhea. Extensive GI workup at [facility]. Recently diagnosed with C. difficile. Diffuse abdominal tenderness with even light palpation. No peritoneal signs  Glynn Octave, MD 08/13/11 [phone]

## 2011-08-13 NOTE — Progress Notes (Signed)
Patient ID: [name], female   DOB: 1970/06/03, 41 y.o.   MRN: [id number] PATIENT DETAILS Name: [name] Age: 41 y.o. 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 difficile colitis.  Her bowel movements decreased and she was discharged to home on by mouth vancomycin with Dilaudid PO for pain. She was advised to seek GYN followup at [facility] within 2 weeks. She also complained of 60 pound unintentional weight loss over the past year, diaphoresis, insomnia, and severe depression.  She lives at home with her husband and 2 children (29 and 10 years old). She has expressed concern over finances and medical expenditures, yet she returns to [facility] rather than [facility] where her insurance would presumably cover her medical expenses.   Subjective: Vomiting started Saturday am and she has been unable to take her oral oral vancomycin since.  She complains of severe depression that stops her from going about her daily activities of living. She denies suicidal ideations.  She complains of severe left lower quadrant abdominal pain that is unrelieved by 1 mg of Dilaudid IV.  Objective: Weight change:   Intake/Output Summary (Last 24 hours) at 08/13/11 1241 Last data filed at 08/13/11 0900  Gross per 24 hour  Intake   1775 ml  Output      0 ml  Net   1775 ml   Blood pressure 130/81, pulse 64, temperature 98.4 F (36.9 C), temperature source Oral, resp. rate 20, height 5\' 8"  (1.727 m), weight 66.6 kg (146 lb 13.2 oz), SpO2 99.00%. Filed Vitals:   08/12/11 1904 08/12/11 2030 08/12/11 2218 08/13/11 0547  BP: 122/79 124/78 108/70 130/81  Pulse: 73 71 69 64  Temp: 98.2 F (36.8 C)  98.2 F (36.8 C) 98.4 F (36.9 C)  TempSrc: Oral  Oral Oral  Resp: 16 16 18 20   Height:   5\' 8"  (1.727 m)   Weight:   66.6 kg  (146 lb 13.2 oz)   SpO2: 100% 98% 97% 99%    Physical Exam: General: No acute distress, appears unhappy/sad Lungs: Clear to auscultation bilaterally without wheezes or crackles Cardiovascular: Regular rate and rhythm without murmur gallop or rub normal S1 and S2 Abdomen: tender tp palpation - worse in LLQ, nondistended, soft, bowel sounds positive, no rebound, no ascites, no appreciable mass Extremities: No significant cyanosis, clubbing, or edema bilateral lower extremities  Basic Metabolic Panel:  Lab 08/13/11 5784 08/12/11 1653  NA 139 136  K 3.3* 3.5  CL 104 96  CO2 26 23  GLUCOSE 88 114*  BUN 9 8  CREATININE 0.67 0.59  CALCIUM 8.8 10.7*  MG 2.1 --  PHOS -- --   Liver Function Tests:  Lab 08/13/11 0530 08/12/11 1653  AST 13 15  ALT 7 10  ALKPHOS 57 83  BILITOT 0.3 0.4  PROT 6.3 8.8*  ALBUMIN 3.2* 4.6    Lab 08/12/11 2250 08/12/11 1653  LIPASE 16 35  AMYLASE -- --   CBC:  Lab 08/12/11 2250 08/12/11 1653  WBC 4.8 5.7  NEUTROABS 2.7 4.2  HGB 12.8 16.1*  HCT 36.6 44.6  MCV 79.7 79.8  PLT 192 214   CBG:  Lab 08/13/11 1130 08/13/11 0652  GLUCAP 103* 105*    Micro Results:   CLOSTRIDIUM DIFFICILE BY PCR  Status: Normal   Collection Time   08/13/11 10:30 AM      Component Value Range Status Comment   C difficile by pcr NEGATIVE  NEGATIVE  Final     Studies/Results:  *RADIOLOGY REPORT*  Clinical Data: Left-sided pelvic pain for 1 week. Status post partial hysterectomy and right oophorectomy.  4/1 TRANSABDOMINAL AND TRANSVAGINAL ULTRASOUND OF PELVIS  DOPPLER ULTRASOUND OF OVARIES   Comparison: 08/05/2011  Findings: Uterus: Partial hysterectomy. The cervix is visualized and demonstrates cystic changes consistent with Nabothian cysts. Endometrium: Surgically absent Right ovary: Surgically absent   Left ovary: The left ovary is enlarged, measuring 6.8 x 3.5 x 5.2 cm. The left ovary contains a complex cystic mass measuring 4.6 x 5.8 x 3.5 cm with a  solid appearing nodular component measuring about 2.7 x 3.3 x 2.5 cm. This could represent a solid tissue or retracted clot. Color flow Doppler images demonstrate minimal flow in the periphery. The appearance is unchanged since the previous study. The lesion remains indeterminate and continued follow-up in six to 12 weeks is recommended.  Scheduled Meds:   . sodium chloride  1,000 mL Intravenous Once  . diphenhydrAMINE  25 mg Intravenous Once  . DULoxetine  60 mg Oral Daily  .  HYDROmorphone (DILAUDID) injection  1 mg Intravenous Once  . HYDROmorphone  2 mg Intravenous Once  . HYDROmorphone  2 mg Intravenous Once  . LORazepam  0.5 mg Intravenous Once  . metoCLOPramide (REGLAN) injection  10 mg Intravenous Once  . metronidazole  500 mg Intravenous Q8H  . ondansetron (ZOFRAN) IV  4 mg Intravenous Once  . potassium chloride  10 mEq Intravenous Q1 Hr x 3  . vancomycin  125 mg Oral Q6H   Continuous Infusions:   . sodium chloride 125 mL/hr at 08/13/11 1007   PRN Meds:.acetaminophen, acetaminophen, clonazePAM, HYDROmorphone, metoCLOPramide (REGLAN) injection, ondansetron (ZOFRAN) IV, ondansetron, oxyCODONE-acetaminophen, DISCONTD: HYDROmorphone  Anti-infectives:  Anti-infectives     Start     Dose/Rate Route Frequency Ordered Stop   08/13/11 0000   vancomycin (VANCOCIN) 50 mg/mL oral solution 125 mg        125 mg Oral 4 times per day 08/12/11 2216     08/12/11 2300   metroNIDAZOLE (FLAGYL) IVPB 500 mg        500 mg 100 mL/hr over 60 Minutes Intravenous Every 8 hours 08/12/11 2215            Assessment/Plan: Principal Problem:  *Nausea and vomiting Active Problems:  Abdominal pain of unknown etiology  Dehydration  Ovarian cystic mass  Diarrhea  Persistant Nausea and Vomiting.  Of uncertain etiology.  Patient is afebrile and does not have leukocytosis.  It is atypical to vomit from C-diff colitis.  Will treat with supportive care.  Patient reports an allergy to phenergan and  requests reglan despite my concerns that it may worsen her diarrhea.Trial of clears and if tolerates will advance diet.  Abdominal Pain-?etiology, doubt C Diff, CT abdomen last admission was unremarkable-except for ovarian cyst. Abdomen is soft, but mild but diffuse tenderness without guarding or rigidity.Acute abdominal series done 4/1 was negative as well.  Diarrhea. Fortunately C-diff PCR is negative.  Patient is on antibiotic therapy for c-diff.  Should be on oral vanc thru 4/6 to complete antibiotic course from previous hospitalization. Continue with oral vancomycin and IV Flagyl  Dehydration from nausea and vomiting.  Resolved with IV hydration.  Left ovarian cyst 4 cm. Thought to be hemorrhagic.  Dr. Henderson Cloud, GYN  has agreed to consult 08/13/11-await recommendations  Depression.  On cymbalta.  Worsening per patient. Has been causing her to stay in bed and not pursue ADLs.  Psychiatry consultation requested 08/13/11-await recommendations  History of DIC last year .  DVT Prophylaxis.  SCDs.   LOS: 1 day   Conley Canal 08/13/2011, 12:41 PM [phone]

## 2011-08-13 NOTE — Progress Notes (Signed)
08/13/2011 Moise Friday SPARKS Case Management Note [phone]  Utilization review completed.  

## 2011-08-13 NOTE — Consult Note (Signed)
CSW following with psych service met with Pt [id number] issues with severe depression.    Pt is married (52yrs) and lives with her spouse and 3 children (ages 31, 77, 39). There is a step-son who lives outside the home and is in the [employer].  Pt reports that her children are "great" and that her spouse is "great."  Pt becomes tearful quickly when discussing family and immediately describes feeling guilty that children are "on their own" but then describes how her spouse is able to handle the children's needs.  Spouse work, but has been able to help shuffle children around to their appts. Pt's parents live less than a mile away from Pt and her sister lives nearby as well. Pt reports that her family is able to help manage the children's needs as well.    Pt has been a direct care nurse for 15 yrs, began having health issues 1 year ago which have again led to current hospitalization. Pt works 8-4:30 M-F and has been on medical leave for 2 wks at time of interview.  Pt reports that depression began in August of 2012.  Tx for depression was managed by Pt's PCP with medication Cymbalta at 60mg  and Klonopin .5mg  at bedtime for 30 days only.  Pt's dose has not increased since that time and she is no longer taking Klonopin. Pt reports that sleep never improved after coming off of Klonopin and has become worse since last October. Pt describes only sleeping 2-3hrs a night total, never hitting a deep/restful sleep.  Pt did not f/u with a therapist for ongoing treatment but reports speaking to her preacher weekly for ongoing support.    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 She self-reported her problem to the nursing board and entered into intensive outpt programs followed by seeing a counselor once a week for 3 years as part of board protocol.  Pt feels that this is a permanent scar to her record and that she is being "judged" by it when  she asks for pain med.  Pt shared that she has not asked for additional help with sleeping meds since her klonopin ran out d/t feeling "judged."  CSW encouraged Pt to discuss a sleep medication with physicians while in the hospital.    Pt remained tearful through much of the conversation. Pt denies current or history of SI but became tearful when asked directly. Pt shared that she feels both hopeless and hopeful at times.  Pt reports having "no energy and loss of appetite." CSW offered support and normalized pt's symptoms given the year of health issues and length of time with no sleep. Pt identified her family and her children as her current strengths. Pt also reports a supportive work environment, but feels that it has become less supportive lately but could not give examples of events that have occurred.  Pt advised that psychiatrist will be seeing her during her visit for futher discussion of medication management and treatment options.    CSW discussed f/u therapy locations and possibility of psych IOP while pt is on medical leave.  Pt is able to drive and has insurance that will allow several treatment options.  CSW will f/u with psychiatrist for treatment recommendations. Pt agreeable to chaplain services offering support during hospitalization.   Frederico Hamman, LCSW [phone]

## 2011-08-13 NOTE — Progress Notes (Signed)
   CARE MANAGEMENT NOTE 08/13/2011  Patient:  [name], [name]   Account Number:  [id number]  Date Initiated:  08/13/2011  Documentation initiated by:  Donn Pierini  Subjective/Objective Assessment:   Pt admitted with N/V     Action/Plan:   PTA pt lived at home with spouse, independent with ADLs   Anticipated DC Date:  08/16/2011   Anticipated DC Plan:  HOME/SELF CARE      DC Planning Services  CM consult      Choice offered to / List presented to:             Status of service:  In process, will continue to follow Medicare Important Message given?   (If response is "NO", the following Medicare IM given date fields will be blank) Date Medicare IM given:   Date Additional Medicare IM given:    Discharge Disposition:    Per UR Regulation:    If discussed at Long Length of Stay Meetings, dates discussed:    Comments:  PCP- [place]  08/14/11- 1100- Donn Pierini RN, BSN [phone] Pt recently discharge home with family- has medication benefits and transportation. Spoke with pt at bedside- per conversation pt did get her po Vancomycin from a Pharmacy in [place] on her last admission. CM to follow for any d/c needs.

## 2011-08-14 LAB — GLUCOSE, CAPILLARY
Glucose-Capillary: 92 mg/dL (ref 70–99)
Glucose-Capillary: 98 mg/dL (ref 70–99)

## 2011-08-14 LAB — CBC
MCV: 80.9 fL (ref 78.0–100.0)
Platelets: 168 10*3/uL (ref 150–400)
RDW: 13.6 % (ref 11.5–15.5)
WBC: 3.6 10*3/uL — ABNORMAL LOW (ref 4.0–10.5)

## 2011-08-14 LAB — BASIC METABOLIC PANEL
Chloride: 104 mEq/L (ref 96–112)
Creatinine, Ser: 0.71 mg/dL (ref 0.50–1.10)
GFR calc Af Amer: >90 mL/min (ref >90)
Sodium: 138 mEq/L (ref 135–145)

## 2011-08-14 LAB — MAGNESIUM: Magnesium: 1.9 mg/dL (ref 1.5–2.5)

## 2011-08-14 MED ORDER — METOCLOPRAMIDE HCL 10 MG PO TABS: 5.0000 mg | ORAL_TABLET | Freq: Three times a day (TID) | ORAL | Status: DC | PRN

## 2011-08-14 MED ORDER — HYDROMORPHONE HCL PF 1 MG/ML IJ SOLN
1.0000 mg | INTRAMUSCULAR | Status: DC | PRN
  Administered 2011-08-14 (×2): 1 mg via INTRAVENOUS
  Filled 2011-08-14 (×2): qty 1

## 2011-08-14 MED ORDER — METOCLOPRAMIDE HCL 5 MG PO TABS: 5.0000 mg | ORAL_TABLET | Freq: Three times a day (TID) | ORAL | Status: AC | PRN

## 2011-08-14 MED ORDER — HYDROMORPHONE HCL PF 1 MG/ML IJ SOLN: 1.0000 mg | INTRAMUSCULAR | Status: DC | PRN

## 2011-08-14 MED ORDER — VANCOMYCIN 50 MG/ML ORAL SOLUTION
250.0000 mg | Freq: Four times a day (QID) | ORAL | Status: DC
  Administered 2011-08-14: 250 mg via ORAL
  Filled 2011-08-14 (×4): qty 5

## 2011-08-14 MED ORDER — VANCOMYCIN 50 MG/ML ORAL SOLUTION: 250.0000 mg | Freq: Four times a day (QID) | ORAL | Status: DC

## 2011-08-14 MED ORDER — OXYCODONE-ACETAMINOPHEN 5-325 MG PO TABS: 1.0000 | ORAL_TABLET | ORAL | Status: AC | PRN

## 2011-08-14 MED ORDER — POTASSIUM CHLORIDE CRYS ER 20 MEQ PO TBCR
40.0000 meq | EXTENDED_RELEASE_TABLET | Freq: Two times a day (BID) | ORAL | Status: DC
  Administered 2011-08-14: 40 meq via ORAL
  Filled 2011-08-14: qty 2

## 2011-08-14 MED ORDER — ACETAMINOPHEN 325 MG PO TABS: 650.0000 mg | ORAL_TABLET | Freq: Four times a day (QID) | ORAL | Status: DC | PRN

## 2011-08-14 NOTE — Discharge Summary (Signed)
Patient ID: [name] MRN: [id number] DOB/AGE: 1971/04/24 41 y.o.  Admit date: 08/12/2011 Discharge date: 08/14/2011  Primary Care Physician:  [name], [place]  Discharge Diagnoses:    Present on Admission:  .Nausea and vomiting .Diarrhea .Abdominal pain of unknown etiology .Dehydration .Ovarian cystic mass  Principal Problem:  *Nausea and vomiting Active Problems:  Abdominal pain of unknown etiology  Dehydration  Ovarian cystic mass  Diarrhea   Medication List  As of 08/14/2011 12:40 PM   STOP taking these medications         HYDROmorphone 4 MG tablet         TAKE these medications         acetaminophen 325 MG tablet   Commonly known as: TYLENOL   Take 2 tablets (650 mg total) by mouth every 6 (six) hours as needed (or Fever >/= 101).      clonazePAM 0.5 MG tablet   Commonly known as: KLONOPIN   Take 0.5 mg by mouth at bedtime as needed. For sleep      DULoxetine 60 MG capsule   Commonly known as: CYMBALTA   Take 60 mg by mouth daily.      HYDROmorphone 1 MG/ML Soln injection   Commonly known as: DILAUDID   Inject 1 mL (1 mg total) into the vein every 3 (three) hours as needed for pain.      metoCLOPramide 5 MG tablet   Commonly known as: REGLAN   Take 1 tablet (5 mg total) by mouth every 8 (eight) hours as needed.      ondansetron 8 MG tablet   Commonly known as: ZOFRAN   Take 8 mg by mouth every 8 (eight) hours as needed. For nausea      oxyCODONE-acetaminophen 5-325 MG per tablet   Commonly known as: PERCOCET   Take 1-2 tablets by mouth every 4 (four) hours as needed.      vancomycin 50 mg/mL oral solution   Commonly known as: VANCOCIN   Take 5 mLs (250 mg total) by mouth every 6 (six) hours.              Brief H and P:   41 year old female who was recently discharged (hospital stay 3/24 - 3/27) after being treated for nausea vomiting and diarrhea and abdominal pain, and at that time patient was positive for C. difficile colitis (via pcr) and  left sided 4 cm ovarian cyst. The patient was discharged home with po vancomycin and was advised to follow with gynecologist at [facility].  At this time, [name] returns to [facility] ED because of recurrence of her nausea vomiting diarrhea with abdominal pain which started off on Saturday morning, 3 days ago.  Because she was vomiting she stopped taking oral vancomycin on Saturday. She reports 10 bowel movements and equal number of vomiting episodes in the last 24 hours. She is unable to keep anything down. It started off as a pain in the left lower quadrant which has been persistent and constant and has no relation to food bowel movement.  Patient has been having these symptoms since last March and has had multiple workup done.  EGD and Colonoscopy are scheduled to be done by [facility] this month.  Of note, the patient also complains of a 60 pound unintentional weight loss in the past year, insomnia, and severe depression.  Hospital Course:   1.  Nausea and vomiting.  [name] has had no vomiting since admission.  Vomiting is atypical  with C. difficile colitis. The patient has tolerated a clear liquid diet and is being advanced to full liquids.  She still complains of nausea that is not remedied with Zofran. She is unable to take Phenergan.  And requests Reglan for nausea. We have given her a limited amounts of Reglan as it may increase her diarrhea.  2.  Left Lower Quadrant Abdominal Pain.  Uncertain etiology. Doubt C-diff.  CT abdomen last admission was unremarkable except for her left ovarian cyst. Acute abdominal series done on April 1 was negative. Abdomen is soft she has mild diffuse tenderness.  Gynecology consultation was requested regarding her left hemorrhagic ovarian cyst, but this has not taken place yet.  3.  Diarrhea.  The patient was C. difficile PCR positive last admission. She was started on by mouth Flagyl but could not tolerate it. She was changed to oral vancomycin  and received approximately 24 hours of antibiotic therapy.  She was then discharged on oral vancomycin and reports that she was able to take it for a another 3 days before she started vomiting and was unable to take medications. She returned to the hospital 36 hours later having 10 bowel movements a day. She was restarted on oral vancomycin which has now been increased to 250 mg 4 times a day.  Her bowel movements have slowed. She had 3 episodes of diarrhea overnight.  This admission she is C. difficile PCR negative.  4.  Left Ovarian Cyst.  The patient had a transvaginal ultrasound on March 25 that showed a complex cystic mass measuring 4.6 x 5.8 x 3.5 cm.  This could represent solid tissue or retracting clot color Doppler images demonstrate minimal flow in the periphery.  Re- imaging on April 1 the mass to be stable without any signs of ovarian torsion.  Gynecologic consultation is pending.  5.  Severe depression. The patient is frequently tearful and describes severe depression that causes her to stay in bed and not pursue activities of daily living. She has been on Cymbalta 60 mg for several months. She describes 60 pound unintentional weight loss as well as insomnia. Psychiatry consultation is pending.  Social worker consultation revealed that the patient's depression began in August of 2012.  There is some question of a previous history of self-reported prescription narcotic abuse in 1997.  At this point the patient is being transferred to [name] in order to receive in-network insurance coverage.   Physical Exam on Discharge: General: Alert, awake, oriented x3, in no acute distress. HEENT: No bruits, no goiter. Heart: Regular rate and rhythm, without murmurs, rubs, gallops. Lungs: Clear to auscultation bilaterally. Abdomen: Soft, nontender, nondistended, positive bowel sounds. Extremities: No clubbing cyanosis or edema with positive pedal pulses. Neuro: Grossly intact, nonfocal.  Filed  Vitals:   08/13/11 1355 08/13/11 2242 08/14/11 0549 08/14/11 1135  BP: 134/85 100/64 112/74 118/74  Pulse: 62 64 61 64  Temp: 98.4 F (36.9 C) 98.3 F (36.8 C) 98.2 F (36.8 C) 98 F (36.7 C)  TempSrc: Oral Oral Oral Oral  Resp: 20 16 16 17   Height:      Weight:      SpO2: 97% 98% 97% 96%     Intake/Output Summary (Last 24 hours) at 08/14/11 1240 Last data filed at 08/14/11 0900  Gross per 24 hour  Intake 3609.42 ml  Output    500 ml  Net 3109.42 ml    Basic Metabolic Panel:  Lab 08/14/11 1610 08/13/11 0530  NA 138 139  K 3.4* 3.3*  CL 104 104  CO2 25 26  GLUCOSE 97 88  BUN 5* 9  CREATININE 0.71 0.67  CALCIUM 8.8 8.8  MG 1.9 2.1  PHOS -- --   Liver Function Tests:  Lab 08/13/11 0530 08/12/11 1653  AST 13 15  ALT 7 10  ALKPHOS 57 83  BILITOT 0.3 0.4  PROT 6.3 8.8*  ALBUMIN 3.2* 4.6    Lab 08/12/11 2250 08/12/11 1653  LIPASE 16 35  AMYLASE -- --   CBC:  Lab 08/14/11 0630 08/12/11 2250 08/12/11 1653  WBC 3.6* 4.8 --  NEUTROABS -- 2.7 4.2  HGB 11.4* 12.8 --  HCT 32.7* 36.6 --  MCV 80.9 79.7 --  PLT 168 192 --   CBG:  Lab 08/14/11 1131 08/14/11 0546 08/14/11 0032 08/13/11 2240 08/13/11 1751 08/13/11 1130  GLUCAP 98 92 80 127* 121* 103*   Fasting Lipid Panel:Urine Drug Screen: Drugs of Abuse     Component Value Date/Time   LABOPIA NEGATIVE 07/28/2010 0933   COCAINSCRNUR NEGATIVE 07/28/2010 0933   LABBENZ NEGATIVE 07/28/2010 0933   AMPHETMU NEGATIVE 07/28/2010 0933      Significant Diagnostic Studies:  US Transvaginal Non-ob  08/12/2011  *RADIOLOGY REPORT*  Clinical Data:  Left-sided pelvic pain for 1 week.  Status post partial hysterectomy and right oophorectomy.  TRANSABDOMINAL AND TRANSVAGINAL ULTRASOUND OF PELVIS DOPPLER ULTRASOUND OF OVARIES  Technique:  Both transabdominal and transvaginal ultrasound examinations of the pelvis were performed. Transabdominal technique was performed for global imaging of the pelvis including uterus, ovaries,  adnexal regions, and pelvic cul-de-sac.  It was necessary to proceed with endovaginal exam following the transabdominal exam to visualize the left ovary and cervix.  Color and duplex Doppler ultrasound was utilized to evaluate blood flow to the ovaries.  Comparison:  08/05/2011  Findings:  Uterus:  Partial hysterectomy.  The cervix is visualized and demonstrates cystic changes consistent with Nabothian cysts.  Endometrium:  Surgically absent  Right ovary: Surgically absent  Left ovary:   The left ovary is enlarged, measuring 6.8 x 3.5 x 5.2 cm.  The left ovary contains a complex cystic mass measuring 4.6 x 5.8 x 3.5 cm with a solid appearing nodular component measuring about 2.7 x 3.3 x 2.5 cm. This could represent a solid tissue or retracted clot.  Color flow Doppler images demonstrate minimal flow in the periphery.  The appearance is unchanged since the previous study.  The lesion remains indeterminate and continued follow-up in six to 12 weeks is recommended.  Pulsed Doppler evaluation demonstrates normal low-resistance arterial and venous waveforms in the left ovary. Normal flow demonstrated within the left ovary on color flow Doppler imaging.  No free pelvic fluid collections.  IMPRESSION: Surgical absence of the uterus with cervix remaining.  Surgical absence of the right ovary.  The left ovary again demonstrates diffuse enlargement with a complex cystic mass.  The mass is stable since the previous study remains indeterminate.  Additional follow- up is recommended at 6-12 weeks.  No sonographic evidence for ovarian torsion.  Original Report Authenticated By: Marlon Pel, M.D.   US Transvaginal Non-ob  08/05/2011  *RADIOLOGY REPORT*  Clinical Data: Prior hysterectomy remotely.  One ovary was removed at that time secondary to endometriosis.  The patient does not recall which ovary was resected.  TRANSABDOMINAL AND TRANSVAGINAL ULTRASOUND OF PELVIS Technique:  Both transabdominal and transvaginal  ultrasound examinations of the pelvis were performed. Transabdominal technique was performed for global imaging of the pelvis including  uterus, ovaries, adnexal regions, and pelvic cul-de-sac.  Comparison: The CT scan from 08/04/2011.   It was necessary to proceed with endovaginal exam following the transabdominal exam to visualize the left ovary.  Findings:  Uterus: Surgically absent  Endometrium: N/A  Right ovary:  Not visualized, likely surgically absent   Left ovary: 6.4 x 4.4 x 6.7 cm.  Within the left ovarian parenchyma, a 4.1 x 3.2 x 5.6 cm complex cystic lesion is identified.  This has a round internal structure showing ultrasound features highly suggestive of retracted clot.  As such, imaging features are felt to be most likely related to a hemorrhagic cyst. However, color Doppler imaging shows apparent flow signal peripherally within the apparent blood clot. While this apparent Doppler signal is probably artifactual, follow-up is recommended.  Other findings: Tiny amount of free fluid is seen in the left adnexal region  IMPRESSION: 5.6 cm complex cystic lesion in the left ovary has indeterminate, but probably benign, characteristics.  Follow-up ultrasound in 6-12 weeks is recommended to ensure resolution. This recommendation follows the consensus statement:  Management of Asymptomatic Ovarian and Other Adnexal Cysts Imaged at [place]:  Society of Radiologists in Ultrasound Consensus Conference Statement. Radiology 2010; [phone].  .  Original Report Authenticated By: ERIC A. MANSELL, M.D.   US Pelvis Complete  08/12/2011  *RADIOLOGY REPORT*  Clinical Data:  Left-sided pelvic pain for 1 week.  Status post partial hysterectomy and right oophorectomy.  TRANSABDOMINAL AND TRANSVAGINAL ULTRASOUND OF PELVIS DOPPLER ULTRASOUND OF OVARIES  Technique:  Both transabdominal and transvaginal ultrasound examinations of the pelvis were performed. Transabdominal technique was performed for global imaging of the pelvis  including uterus, ovaries, adnexal regions, and pelvic cul-de-sac.  It was necessary to proceed with endovaginal exam following the transabdominal exam to visualize the left ovary and cervix.  Color and duplex Doppler ultrasound was utilized to evaluate blood flow to the ovaries.  Comparison:  08/05/2011  Findings:  Uterus:  Partial hysterectomy.  The cervix is visualized and demonstrates cystic changes consistent with Nabothian cysts.  Endometrium:  Surgically absent  Right ovary: Surgically absent  Left ovary:   The left ovary is enlarged, measuring 6.8 x 3.5 x 5.2 cm.  The left ovary contains a complex cystic mass measuring 4.6 x 5.8 x 3.5 cm with a solid appearing nodular component measuring about 2.7 x 3.3 x 2.5 cm. This could represent a solid tissue or retracted clot.  Color flow Doppler images demonstrate minimal flow in the periphery.  The appearance is unchanged since the previous study.  The lesion remains indeterminate and continued follow-up in six to 12 weeks is recommended.  Pulsed Doppler evaluation demonstrates normal low-resistance arterial and venous waveforms in the left ovary. Normal flow demonstrated within the left ovary on color flow Doppler imaging.  No free pelvic fluid collections.  IMPRESSION: Surgical absence of the uterus with cervix remaining.  Surgical absence of the right ovary.  The left ovary again demonstrates diffuse enlargement with a complex cystic mass.  The mass is stable since the previous study remains indeterminate.  Additional follow- up is recommended at 6-12 weeks.  No sonographic evidence for ovarian torsion.  Original Report Authenticated By: Marlon Pel, M.D.   US Pelvis Complete  08/05/2011  *RADIOLOGY REPORT*  Clinical Data: Prior hysterectomy remotely.  One ovary was removed at that time secondary to endometriosis.  The patient does not recall which ovary was resected.  TRANSABDOMINAL AND TRANSVAGINAL ULTRASOUND OF PELVIS Technique:  Both transabdominal  and  transvaginal ultrasound examinations of the pelvis were performed. Transabdominal technique was performed for global imaging of the pelvis including uterus, ovaries, adnexal regions, and pelvic cul-de-sac.  Comparison: The CT scan from 08/04/2011.   It was necessary to proceed with endovaginal exam following the transabdominal exam to visualize the left ovary.  Findings:  Uterus: Surgically absent  Endometrium: N/A  Right ovary:  Not visualized, likely surgically absent   Left ovary: 6.4 x 4.4 x 6.7 cm.  Within the left ovarian parenchyma, a 4.1 x 3.2 x 5.6 cm complex cystic lesion is identified.  This has a round internal structure showing ultrasound features highly suggestive of retracted clot.  As such, imaging features are felt to be most likely related to a hemorrhagic cyst. However, color Doppler imaging shows apparent flow signal peripherally within the apparent blood clot. While this apparent Doppler signal is probably artifactual, follow-up is recommended.  Other findings: Tiny amount of free fluid is seen in the left adnexal region  IMPRESSION: 5.6 cm complex cystic lesion in the left ovary has indeterminate, but probably benign, characteristics.  Follow-up ultrasound in 6-12 weeks is recommended to ensure resolution. This recommendation follows the consensus statement:  Management of Asymptomatic Ovarian and Other Adnexal Cysts Imaged at [place]:  Society of Radiologists in Ultrasound Consensus Conference Statement. Radiology 2010; [phone].  .  Original Report Authenticated By: ERIC A. MANSELL, M.D.   Ct Abdomen Pelvis W Contrast  08/04/2011  *RADIOLOGY REPORT*  Clinical Data: Abdominal pain  CT ABDOMEN AND PELVIS WITH CONTRAST  Technique:  Multidetector CT imaging of the abdomen and pelvis was performed following the standard protocol during bolus administration of intravenous contrast. Sagittal and coronal MPR images reconstructed from axial data set.  Contrast:  Dilute oral contrast. 100 ml  Omnipaque 300 IV.  Comparison: 08/22/2012CT abdomen, 08/01/2010 CT abdomen pelvis  Findings: Minimal dependent atelectasis at lung bases. Gallbladder surgically absent. Small hepatic cysts. Splenic enlargement. No additional focal abnormalities of liver, spleen, pancreas, kidneys, or adrenal glands identified. Normal appendix.  Low lying cecum in the right pelvis. Unremarkable bladder and uterus. Septated mass superior posterior to the uterus, 6.0 x 5.9 x 5.2 cm in size likely ovarian origin though uncertain if left versus right, favor left. No additional masses, adenopathy, free fluid or hernia. Stomach and bowel loops grossly unremarkable, though colon is underdistended and inadequately opacified by contrast rendering assessment of wall thickness suboptimal. No acute osseous findings.  IMPRESSION: Splenic enlargement. Small hepatic cysts. Septated low attenuation mass adjacent to the uterus, suspect complicated cystic ovarian mass; recommend characterization by transabdominal and transvaginal sonography of the pelvis.  Original Report Authenticated By: Lollie Marrow, M.D.   [place] Art/ven Flow Abd Pelv Doppler  08/12/2011  *RADIOLOGY REPORT*  Clinical Data:  Left-sided pelvic pain for 1 week.  Status post partial hysterectomy and right oophorectomy.  TRANSABDOMINAL AND TRANSVAGINAL ULTRASOUND OF PELVIS DOPPLER ULTRASOUND OF OVARIES  Technique:  Both transabdominal and transvaginal ultrasound examinations of the pelvis were performed. Transabdominal technique was performed for global imaging of the pelvis including uterus, ovaries, adnexal regions, and pelvic cul-de-sac.  It was necessary to proceed with endovaginal exam following the transabdominal exam to visualize the left ovary and cervix.  Color and duplex Doppler ultrasound was utilized to evaluate blood flow to the ovaries.  Comparison:  08/05/2011  Findings:  Uterus:  Partial hysterectomy.  The cervix is visualized and demonstrates cystic changes consistent  with Nabothian cysts.  Endometrium:  Surgically absent  Right ovary: Surgically absent  Left ovary:   The left ovary is enlarged, measuring 6.8 x 3.5 x 5.2 cm.  The left ovary contains a complex cystic mass measuring 4.6 x 5.8 x 3.5 cm with a solid appearing nodular component measuring about 2.7 x 3.3 x 2.5 cm. This could represent a solid tissue or retracted clot.  Color flow Doppler images demonstrate minimal flow in the periphery.  The appearance is unchanged since the previous study.  The lesion remains indeterminate and continued follow-up in six to 12 weeks is recommended.  Pulsed Doppler evaluation demonstrates normal low-resistance arterial and venous waveforms in the left ovary. Normal flow demonstrated within the left ovary on color flow Doppler imaging.  No free pelvic fluid collections.  IMPRESSION: Surgical absence of the uterus with cervix remaining.  Surgical absence of the right ovary.  The left ovary again demonstrates diffuse enlargement with a complex cystic mass.  The mass is stable since the previous study remains indeterminate.  Additional follow- up is recommended at 6-12 weeks.  No sonographic evidence for ovarian torsion.  Original Report Authenticated By: Marlon Pel, M.D.   Dg Abd Acute W/chest  08/12/2011  *RADIOLOGY REPORT*  Clinical Data: Abdominal pain  ACUTE ABDOMEN SERIES (ABDOMEN 2 VIEW & CHEST 1 VIEW)  Comparison: Prior study 08/04/2011.  Findings: The upright chest x-ray is normal.  Two views of the abdomen demonstrate scattered air throughout the colon and scattered air filled small bowel loops but no distention or air fluid levels.  No free air.  The soft tissue shadows are maintained.  The bony structures are intact.  IMPRESSION:  1.  Normal chest x-ray. 2.  No plain film findings for an acute abdominal process.  Original Report Authenticated By: P. Loralie Champagne, M.D.     Disposition and Follow-up: Stable for discharge to [facility]  Discharge  Orders    Future Orders Please Complete By Expires   Increase activity slowly        Follow-up Information    Follow up with [facility] .          Time spent on Discharge: 40 min.  Signed: Conley Canal [facility] 08/14/2011, 12:40 PM [phone]

## 2011-08-14 NOTE — Discharge Summary (Signed)
Addendum  Patient seen and examined, chart and data base reviewed.  I agree with the above assessment and plan  For full details please see Mrs. Algis Downs PA. Note.  To be transferred to [facility] for further medical care.  Clint Lipps Pager: [phone] 08/14/2011, 4:44 PM

## 2011-08-14 NOTE — Consult Note (Signed)
Gyn Consult.  Unable to see pt before this time; pt is about to be transferred to [facility].  Briefly, pt describes an acute onset of crampy pain last week that now is constant.  It is on the left.  She also has C Diff colitis which is still unresolved.    U/S showed surgically absent uterus and R ovary.  The L ovary is 6.8x5.2 with a 5.8x4.6 complex cystic mass with a solid appearing nodular area of 2.7 cm.  It could represent a solid tissue or retracted clot.  This is the same as the previous U/S and there is neither increased blood flow or absent blood flow consistent with torsion.  Free fluid is not noted.  It is still possible that a complex hemorrhagic cyst is resolving despite continued pain.  I believe that [facility] will adequately evaluate the ovary.  I did not do a pelvic exam as the pt is being loaded on Care link now

## 2011-08-14 NOTE — Progress Notes (Signed)
   CARE MANAGEMENT NOTE 08/14/2011  Patient:  [name], [name]   Account Number:  [id number]  Date Initiated:  08/13/2011  Documentation initiated by:  Donn Pierini  Subjective/Objective Assessment:   Pt admitted with N/V     Action/Plan:   PTA pt lived at home with spouse, independent with ADLs   Anticipated DC Date:  08/16/2011   Anticipated DC Plan:  HOME/SELF CARE      DC Planning Services  CM consult      Choice offered to / List presented to:             Status of service:  Completed, signed off Medicare Important Message given?   (If response is "NO", the following Medicare IM given date fields will be blank) Date Medicare IM given:   Date Additional Medicare IM given:    Discharge Disposition:  ACUTE TO ACUTE TRANS  Per UR Regulation:    If discussed at Long Length of Stay Meetings, dates discussed:    Comments:  PCP- [place]  08/14/11- 1030- Donn Pierini RN, BSN [phone] Received call from pt's insurance payer that if pt did not have planned discharge- pt would need to consider transferring to an in network hospital. Discussed this with pt along with PA, and pt is agreeable to tx to [facility]- PA to call [facility] regarding transfer. Pt to transfer to [facility] when bed available.  08/13/11- 1100- Donn Pierini RN, BSN [phone] Pt recently discharge home with family- has medication benefits and transportation. Spoke with pt at bedside- per conversation pt did get her po Vancomycin from a Pharmacy in [place] on her last admission. CM to follow for any d/c needs.

## 2011-08-14 NOTE — Progress Notes (Signed)
Pt discharged to [facility] via ambulance. Pt is hemodynamically stable. Report given to transporters and previously called to [facility]. Pt medicated for pain prior to discharge. Care Link to continue to monitor. Dondra Spry   .

## 2011-08-15 MED FILL — Hydromorphone HCl Inj 1 MG/ML: INTRAMUSCULAR | Qty: 1 | Status: AC

## 2011-08-15 MED FILL — Ondansetron HCl Inj 4 MG/2ML (2 MG/ML): INTRAMUSCULAR | Qty: 2 | Status: AC

## 2012-05-12 ENCOUNTER — Emergency Department (HOSPITAL_COMMUNITY)
Admission: EM | Admit: 2012-05-12 | Discharge: 2012-05-13 | Disposition: A | Discharge status: Home / Self Care | Payer: Self-pay | Attending: Emergency Medicine | Admitting: Emergency Medicine

## 2012-05-12 ENCOUNTER — Encounter (HOSPITAL_COMMUNITY): Payer: Self-pay | Admitting: Emergency Medicine

## 2012-05-12 DIAGNOSIS — Z9089 Acquired absence of other organs: Secondary | ICD-10-CM | POA: Insufficient documentation

## 2012-05-12 DIAGNOSIS — Z862 Personal history of diseases of the blood and blood-forming organs and certain disorders involving the immune mechanism: Secondary | ICD-10-CM | POA: Insufficient documentation

## 2012-05-12 DIAGNOSIS — F3289 Other specified depressive episodes: Secondary | ICD-10-CM | POA: Insufficient documentation

## 2012-05-12 DIAGNOSIS — F17200 Nicotine dependence, unspecified, uncomplicated: Secondary | ICD-10-CM | POA: Insufficient documentation

## 2012-05-12 DIAGNOSIS — Z79899 Other long term (current) drug therapy: Secondary | ICD-10-CM | POA: Insufficient documentation

## 2012-05-12 DIAGNOSIS — G8929 Other chronic pain: Secondary | ICD-10-CM | POA: Insufficient documentation

## 2012-05-12 DIAGNOSIS — F411 Generalized anxiety disorder: Secondary | ICD-10-CM | POA: Insufficient documentation

## 2012-05-12 DIAGNOSIS — F329 Major depressive disorder, single episode, unspecified: Secondary | ICD-10-CM

## 2012-05-12 DIAGNOSIS — Z8742 Personal history of other diseases of the female genital tract: Secondary | ICD-10-CM | POA: Insufficient documentation

## 2012-05-12 LAB — CBC WITH DIFFERENTIAL/PLATELET
Basophils Absolute: 0 10*3/uL (ref 0.0–0.1)
Basophils Relative: 0 % (ref 0–1)
Eosinophils Absolute: 0.1 10*3/uL (ref 0.0–0.7)
Eosinophils Relative: 2 % (ref 0–5)
HCT: 40.2 % (ref 36.0–46.0)
Hemoglobin: 14.3 g/dL (ref 12.0–15.0)
Lymphocytes Relative: 29 % (ref 12–46)
Lymphs Abs: 1.6 10*3/uL (ref 0.7–4.0)
MCH: 30 pg (ref 26.0–34.0)
MCHC: 35.6 g/dL (ref 30.0–36.0)
MCV: 84.3 fL (ref 78.0–100.0)
Monocytes Absolute: 0.4 10*3/uL (ref 0.1–1.0)
Monocytes Relative: 7 % (ref 3–12)
Neutro Abs: 3.6 10*3/uL (ref 1.7–7.7)
Neutrophils Relative %: 63 % (ref 43–77)
Platelets: 200 10*3/uL (ref 150–400)
RBC: 4.77 MIL/uL (ref 3.87–5.11)
RDW: 12.7 % (ref 11.5–15.5)
WBC: 5.7 10*3/uL (ref 4.0–10.5)

## 2012-05-12 LAB — RAPID URINE DRUG SCREEN, HOSP PERFORMED
Amphetamines: NOT DETECTED
Barbiturates: NOT DETECTED
Benzodiazepines: POSITIVE — AB
Cocaine: NOT DETECTED
Opiates: NOT DETECTED
Tetrahydrocannabinol: NOT DETECTED

## 2012-05-12 LAB — COMPREHENSIVE METABOLIC PANEL
ALT: 11 U/L (ref 0–35)
AST: 15 U/L (ref 0–37)
Albumin: 3.7 g/dL (ref 3.5–5.2)
Alkaline Phosphatase: 63 U/L (ref 39–117)
Glucose, Bld: 103 mg/dL — ABNORMAL HIGH (ref 70–99)
Potassium: 3.1 mEq/L — ABNORMAL LOW (ref 3.5–5.1)
Sodium: 141 mEq/L (ref 135–145)
Total Protein: 7 g/dL (ref 6.0–8.3)

## 2012-05-12 LAB — ETHANOL: Alcohol, Ethyl (B): <11 mg/dL (ref 0–11)

## 2012-05-12 LAB — COMPREHENSIVE METABOLIC PANEL WITH GFR
BUN: 8 mg/dL (ref 6–23)
CO2: 23 meq/L (ref 19–32)
Calcium: 9.5 mg/dL (ref 8.4–10.5)
Chloride: 103 meq/L (ref 96–112)
Creatinine, Ser: 0.67 mg/dL (ref 0.50–1.10)
GFR calc Af Amer: >90 mL/min (ref >90)
GFR calc non Af Amer: >90 mL/min (ref >90)
Total Bilirubin: 0.3 mg/dL (ref 0.3–1.2)

## 2012-05-12 LAB — SALICYLATE LEVEL: Salicylate Lvl: <2 mg/dL — ABNORMAL LOW (ref 2.8–20.0)

## 2012-05-12 LAB — ACETAMINOPHEN LEVEL: Acetaminophen (Tylenol), Serum: <15 ug/mL (ref 10–30)

## 2012-05-12 NOTE — ED Notes (Signed)
PAPER SCRUBS /NON SLID SOCKS GIVEN TO PT. , CHARGE NURSE NOTIFIED FOR PT.'S  SITTER , SECURITY NOTIFIED TO WAND PT. NURSE EXPLAINED PROCESS TO PT.

## 2012-05-12 NOTE — ED Notes (Signed)
Pt states she has endometriosis and has been self medicating herself. Pt states that she does not want to be strung out. Pt states that she is depressed. Pt states she would like to be pain free. Pt states she is not thinking about hurting herself but she doesn't know if she wants to be here anymore. Pt denies plan for suicide. Pt states "I would probably be too chicken."

## 2012-05-12 NOTE — ED Notes (Signed)
PT. WEARING PAPER SCRUBS , WANDED BY SECURITY  AND SITTER IS WITH PT.

## 2012-05-12 NOTE — ED Provider Notes (Signed)
History     CSN: [id number]  Arrival date & time 05/12/12  1952   First MD Initiated Contact with Patient 05/12/12 2211      Chief Complaint  Patient presents with  . Suicidal    (Consider location/radiation/quality/duration/timing/severity/associated sxs/prior treatment) HPI  [name] is a 41 y.o. female complaining of complaining of vague suicidal ideations and addiction to prescription pain medication. Patient is scheduled to check into a detox facility in [place] on Monday. Patient denies any prior suicide attempt, illicit drug use, homicidal ideation. Patient lost her job in June which is very stressful to her. She has chronic pain secondary to endometriosis. She used to be taking Celexa she is not taking that anymore secondary to financial concerns.    Past Medical History  Diagnosis Date  . S/P laparoscopic cholecystectomy   . Clotting disorder   . Depression   . Anxiety   . Blood dyscrasia     hx of clotting disorder  . Ovarian cyst     Past Surgical History  Procedure Date  . Abdominal hysterectomy   . Cholecystectomy   . Ceasarean     No family history on file.  History  Substance Use Topics  . Smoking status: Current Every Day Smoker -- 2.0 packs/day for 20 years    Types: Cigarettes  . Smokeless tobacco: Never Used  . Alcohol Use: No    OB History    Grav Para Term Preterm Abortions TAB SAB Ect Mult Living                  Review of Systems  Constitutional: Negative for fever.  Respiratory: Negative for shortness of breath.   Cardiovascular: Negative for chest pain.  Gastrointestinal: Negative for nausea, vomiting, abdominal pain and diarrhea.  Psychiatric/Behavioral: Positive for suicidal ideas. Negative for hallucinations and self-injury.  All other systems reviewed and are negative.    Allergies  Promethazine hcl  Home Medications   Current Outpatient Rx  Name  Route  Sig  Dispense  Refill  . ACETAMINOPHEN 325 MG PO TABS    Oral   Take 650 mg by mouth every 6 (six) hours as needed. For lower abdominal pain         . ALPRAZOLAM 0.25 MG PO TABS   Oral   Take 0.25 mg by mouth 2 (two) times daily as needed. For anxiety         . DIPHENHYDRAMINE HCL (SLEEP) 25 MG PO TABS   Oral   Take 25 mg by mouth daily as needed. For sleep         . HYDROCODONE-ACETAMINOPHEN 5-325 MG PO TABS   Oral   Take 1 tablet by mouth 2 (two) times daily as needed. For lower quadrant pain         . IBUPROFEN 200 MG PO TABS   Oral   Take 600 mg by mouth every 6 (six) hours as needed. For lower abdomen pain         . MELATONIN 5 MG PO TABS   Oral   Take 1 tablet by mouth.         . ONDANSETRON HCL 8 MG PO TABS   Oral   Take 4 mg by mouth every 8 (eight) hours as needed. For nausea         . OXYCODONE HCL 5 MG PO CAPS   Oral   Take 5 mg by mouth every 6 (six) hours as needed. For lower quadrant pain         .  METOCLOPRAMIDE HCL 5 MG PO TABS   Oral   Take 1 tablet (5 mg total) by mouth every 8 (eight) hours as needed.           BP 137/92  Pulse 87  Temp 98.7 F (37.1 C) (Oral)  Resp 18  SpO2 100%  Physical Exam  Nursing note and vitals reviewed. Constitutional: She is oriented to person, place, and time. She appears well-developed and well-nourished. No distress.  HENT:  Head: Normocephalic.  Mouth/Throat: Oropharynx is clear and moist.  Eyes: Conjunctivae normal and EOM are normal. Pupils are equal, round, and reactive to light.  Cardiovascular: Normal rate, regular rhythm and intact distal pulses.   Pulmonary/Chest: Effort normal and breath sounds normal. No stridor. No respiratory distress. She has no wheezes. She has no rales. She exhibits no tenderness.  Abdominal: Soft. Bowel sounds are normal. She exhibits no distension and no mass. There is no tenderness. There is no rebound and no guarding.  Musculoskeletal: Normal range of motion.  Neurological: She is alert and oriented to person, place,  and time.  Psychiatric: She has a normal mood and affect.    ED Course  Procedures (including critical care time)  Labs Reviewed  URINE RAPID DRUG SCREEN (HOSP PERFORMED) - Abnormal; Notable for the following:    Benzodiazepines POSITIVE (*)     All other components within normal limits  COMPREHENSIVE METABOLIC PANEL - Abnormal; Notable for the following:    Potassium 3.1 (*)     Glucose, Bld 103 (*)     All other components within normal limits  SALICYLATE LEVEL - Abnormal; Notable for the following:    Salicylate Lvl <2.0 (*)     All other components within normal limits  ETHANOL  CBC WITH DIFFERENTIAL  ACETAMINOPHEN LEVEL   No results found.   1. Depression       MDM  Patient with a sat suicidal ideation and no plan. She also has substance abuse issues secondary to chronic pain from endometriosis. Act team consulted and telemetry psych ordered. Patient is medically cleared for psychiatric evaluation.  Telemetry psych recommends discharge to home. I will also start her on Celexa 20 mg daily.        Wynetta Emery, PA-C 05/13/12 2142

## 2012-05-12 NOTE — ED Notes (Signed)
PT. REPORTS DEPRESSION AND SUICIDAL IDEATION ( PLANS TO OVERDOSE ) , ALSO REQUESTING DETOX FOR VICODIN /OXYCODONE/XANAX ABUSE.

## 2012-05-13 ENCOUNTER — Inpatient Hospital Stay: Payer: Self-pay | Admitting: Psychiatry

## 2012-05-13 LAB — COMPREHENSIVE METABOLIC PANEL
Albumin: 3.8 g/dL (ref 3.4–5.0)
Alkaline Phosphatase: 83 U/L (ref 50–136)
BUN: 9 mg/dL (ref 7–18)
Creatinine: 0.81 mg/dL (ref 0.60–1.30)
EGFR (African American): >60
Glucose: 93 mg/dL (ref 65–99)
Potassium: 3.6 mmol/L (ref 3.5–5.1)
Sodium: 139 mmol/L (ref 136–145)

## 2012-05-13 LAB — CBC
HCT: 40.8 % (ref 35.0–47.0)
HGB: 14.5 g/dL (ref 12.0–16.0)
Platelet: 190 10*3/uL (ref 150–440)
RBC: 4.71 10*6/uL (ref 3.80–5.20)
RDW: 13.2 % (ref 11.5–14.5)
WBC: 6.6 10*3/uL (ref 3.6–11.0)

## 2012-05-13 LAB — DRUG SCREEN, URINE
Amphetamines, Ur Screen: NEGATIVE (ref ?–1000)
Barbiturates, Ur Screen: NEGATIVE (ref ?–200)
Benzodiazepine, Ur Scrn: POSITIVE (ref ?–200)
Cannabinoid 50 Ng, Ur ~~LOC~~: NEGATIVE (ref ?–50)
Cocaine Metabolite,Ur ~~LOC~~: NEGATIVE (ref ?–300)
MDMA (Ecstasy)Ur Screen: NEGATIVE (ref ?–500)
Phencyclidine (PCP) Ur S: NEGATIVE (ref ?–25)

## 2012-05-13 LAB — ETHANOL: Ethanol %: <0.003 % (ref 0.000–0.080)

## 2012-05-13 LAB — SALICYLATE LEVEL: Salicylates, Serum: 2.2 mg/dL

## 2012-05-13 MED ORDER — ONDANSETRON 4 MG PO TBDP
ORAL_TABLET | ORAL | Status: AC
  Administered 2012-05-13: 8 mg via ORAL
  Filled 2012-05-13: qty 2

## 2012-05-13 MED ORDER — HYDROCODONE-ACETAMINOPHEN 5-325 MG PO TABS
1.0000 | ORAL_TABLET | Freq: Two times a day (BID) | ORAL | Status: DC | PRN
  Administered 2012-05-13: 1 via ORAL
  Filled 2012-05-13: qty 1

## 2012-05-13 MED ORDER — METOCLOPRAMIDE HCL 5 MG PO TABS
5.0000 mg | ORAL_TABLET | Freq: Three times a day (TID) | ORAL | Status: DC | PRN
  Filled 2012-05-13: qty 1

## 2012-05-13 MED ORDER — OXYCODONE HCL 5 MG PO TABS: 5.0000 mg | ORAL_TABLET | Freq: Four times a day (QID) | ORAL | Status: DC | PRN

## 2012-05-13 MED ORDER — ACETAMINOPHEN 325 MG PO TABS: 650.0000 mg | ORAL_TABLET | Freq: Four times a day (QID) | ORAL | Status: DC | PRN

## 2012-05-13 MED ORDER — ALPRAZOLAM 0.25 MG PO TABS: 0.2500 mg | ORAL_TABLET | Freq: Two times a day (BID) | ORAL | Status: DC | PRN

## 2012-05-13 MED ORDER — IBUPROFEN 200 MG PO TABS: 600.0000 mg | ORAL_TABLET | Freq: Four times a day (QID) | ORAL | Status: DC | PRN

## 2012-05-13 MED ORDER — ONDANSETRON 4 MG PO TBDP
8.0000 mg | ORAL_TABLET | Freq: Once | ORAL | Status: AC
Start: 2012-05-13 — End: 2012-05-13
  Administered 2012-05-13: 8 mg via ORAL

## 2012-05-13 MED ORDER — CITALOPRAM HYDROBROMIDE 20 MG PO TABS: 20.0000 mg | ORAL_TABLET | Freq: Every day | ORAL | Status: DC

## 2012-05-13 NOTE — ED Provider Notes (Signed)
Medical screening examination/treatment/procedure(s) were performed by non-physician practitioner and as supervising physician I was immediately available for consultation/collaboration.   [name]. Oletta Lamas, MD 05/13/12 2338

## 2012-05-13 NOTE — ED Notes (Signed)
PERSONAL BELONGINGS GIVEN TO PT.

## 2012-05-13 NOTE — ED Notes (Signed)
TELECONFERENCE WITH PSYCHIATRIST IN PROGRESS.

## 2012-05-31 IMAGING — CT CT ABD-PELV W/ CM
2 of 5 series · 16 of 46 positions shown, 18 images · IV contrast (APPLIED)
Comparison: CT chest of 07/27/2010

CT CHEST

CLINICAL DATA: Abdominal pain, hypertension, recent nausea,
vomiting, and diarrhea

CT CHEST, ABDOMEN AND PELVIS WITH CONTRAST
TECHNIQUE: Multidetector CT imaging of the chest, abdomen and
pelvis was performed following the standard protocol during bolus
administration of intravenous contrast.
Contrast: 100 ml Tmnipaque-IHH

[Series 2: c/a/p 5.0 b31f · axial · 0.73mm/px · z∈[-639,-49]mm · 13 of 132 slices shown, 15 images]
[im 7/132  soft-tissue]
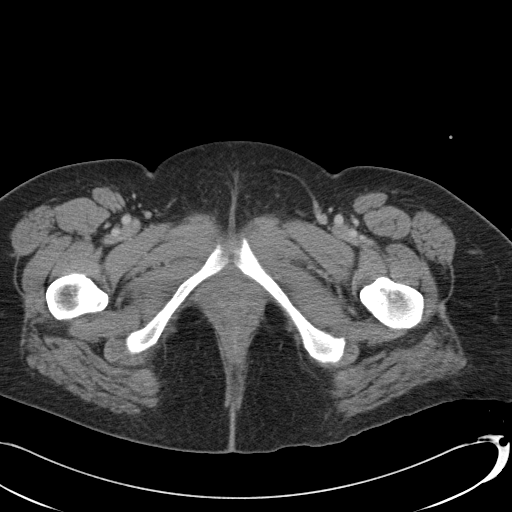
[im 7/132  bone]
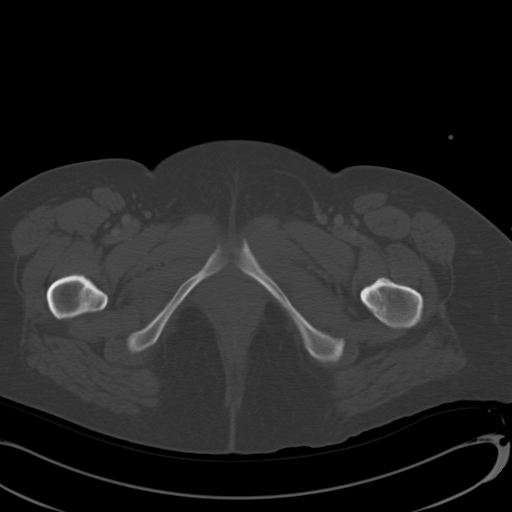
[im 21/132  soft-tissue]
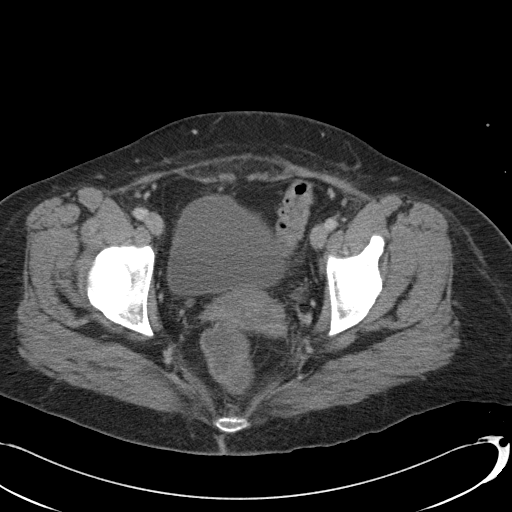
[im 28/132  soft-tissue]
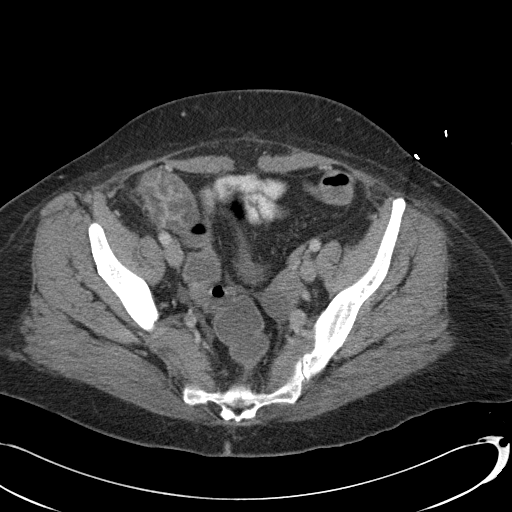
[im 35/132  soft-tissue]
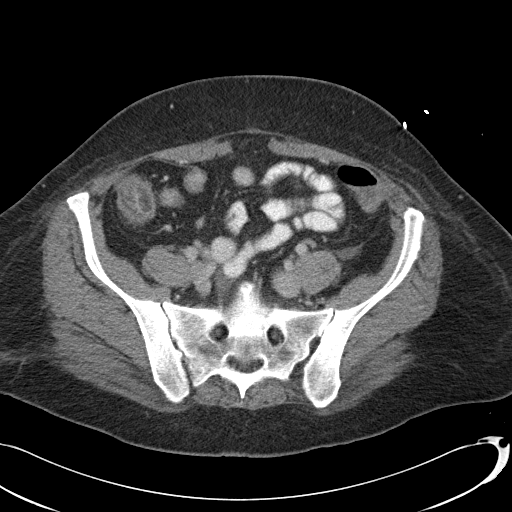
[im 49/132  soft-tissue]
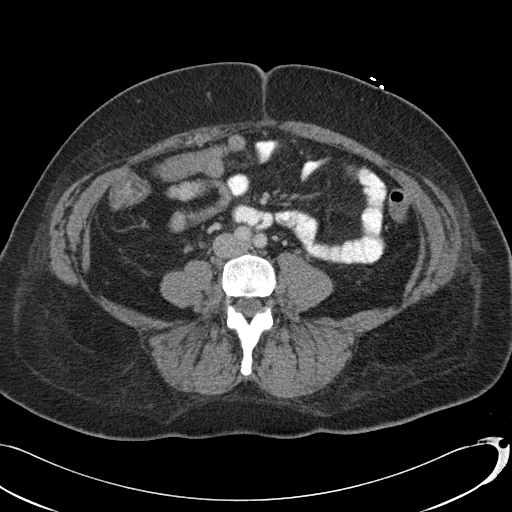
[im 56/132  soft-tissue]
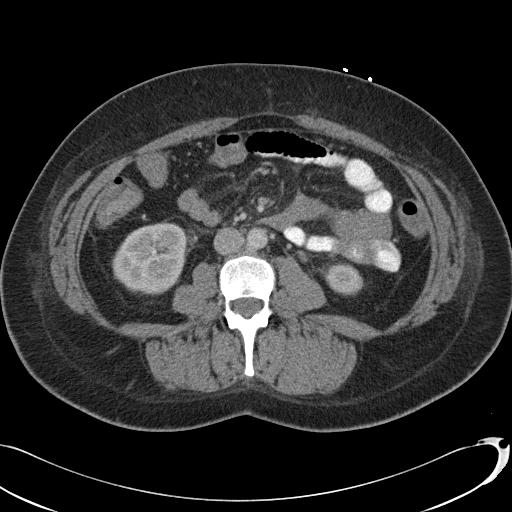
[im 69/132  soft-tissue]
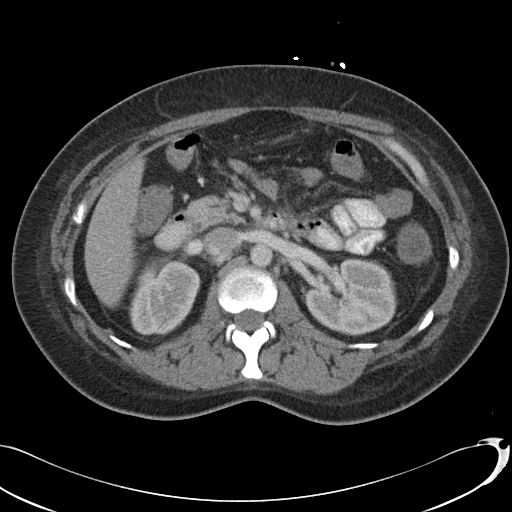
[im 76/132  soft-tissue]
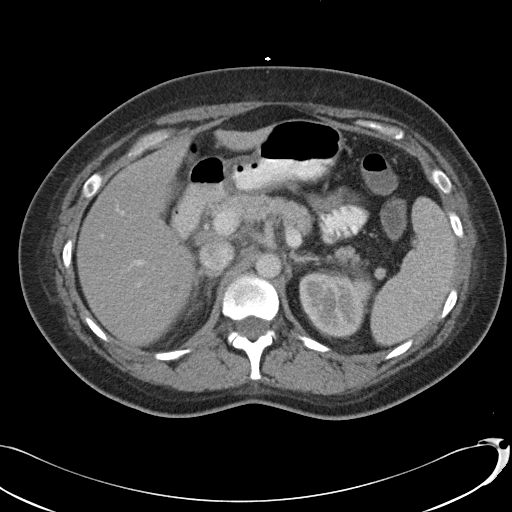
[im 83/132  soft-tissue]
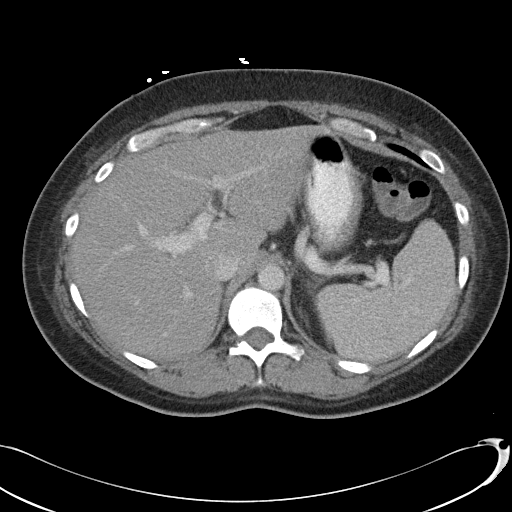
[im 83/132  bone]
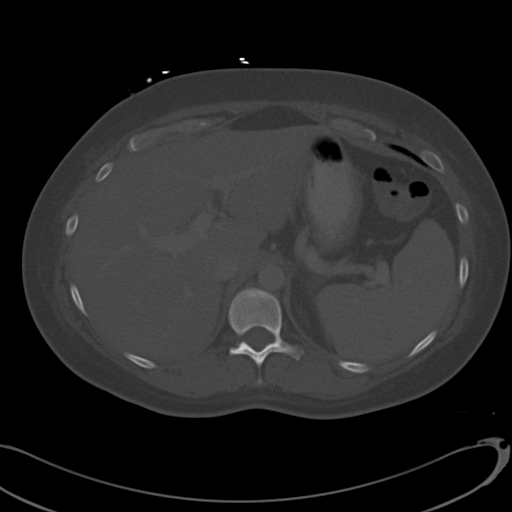
[im 97/132  soft-tissue]
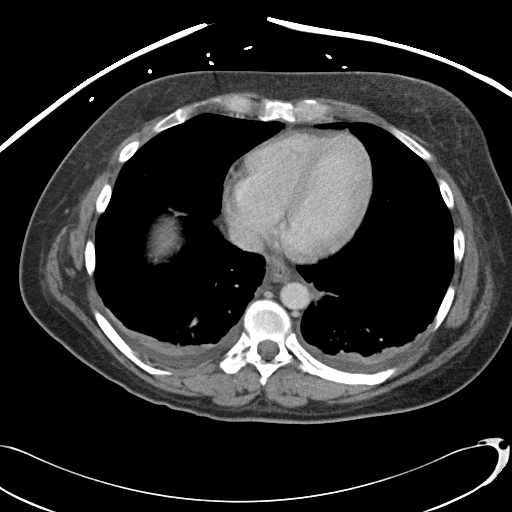
[im 104/132  soft-tissue]
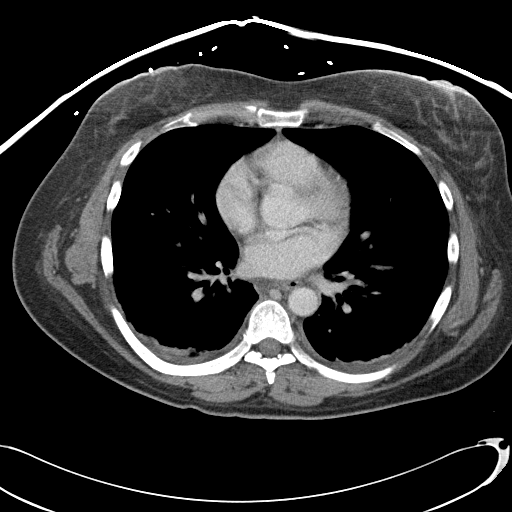
[im 111/132  soft-tissue]
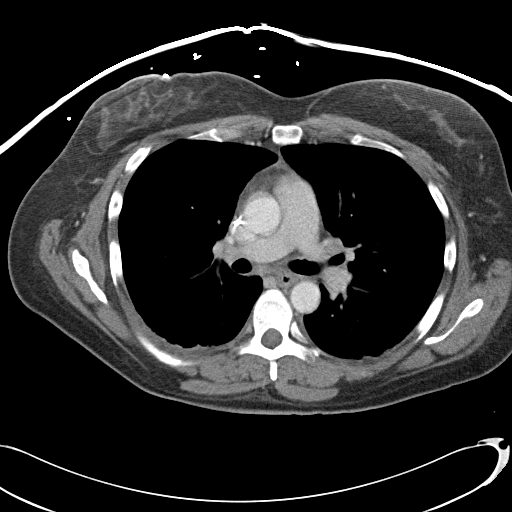
[im 125/132  soft-tissue]
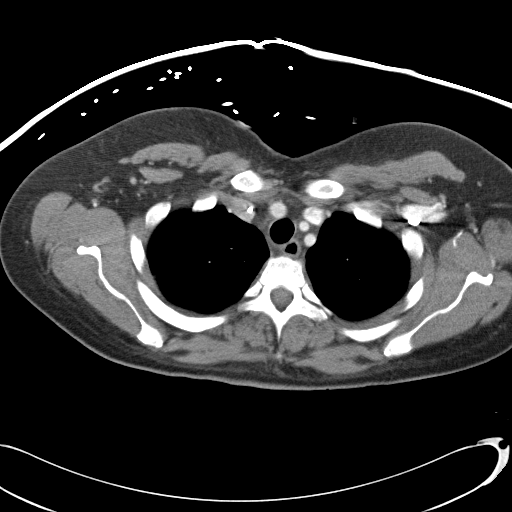

[Series 602: <mpr thick range> · coronal · 1.28mm/px · 3 of 87 slices shown]
[im 29/87  soft-tissue]
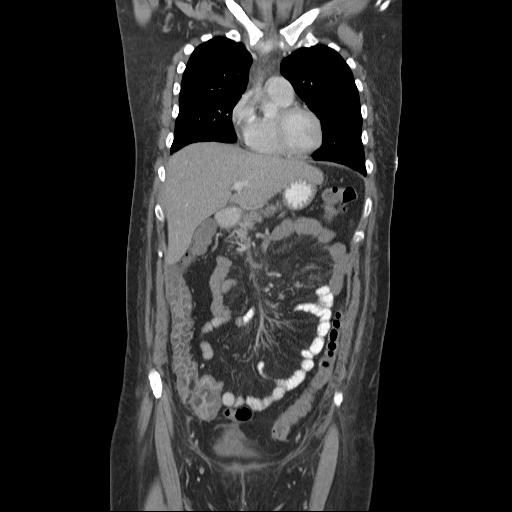
[im 39/87  soft-tissue]
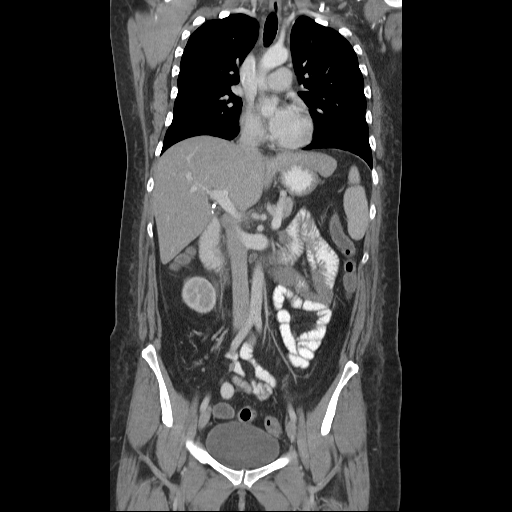
[im 48/87  soft-tissue]
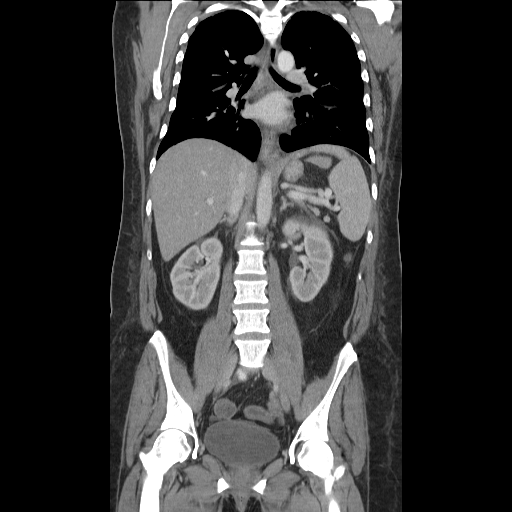

[16 of 46 positions shown; findings below may reference images not displayed]

FINDINGS: Small pleural effusions have increased in volume.  There
has been increase in bibasilar atelectasis dependently.  The nodule
noted previously within the superior segment of the right lower
lobe is not as well seen, obscured by atelectasis and fluid.  No
focal infiltrate is seen.

On soft tissue window images, a central venous line is noted to the
SVC.  The pulmonary arteries and thoracic aorta are unremarkable.
Heart is within normal limits in size.  No mediastinal or hilar
adenopathy is seen.
IMPRESSION: Slight increase in small pleural effusions and basilar atelectasis.

CT ABDOMEN AND PELVIS
FINDINGS: The liver enhances with no focal abnormality and no
ductal dilatation is seen.  Probable small cyst in the lateral
segment left lobe of liver appears stable.  Surgical clips are
present from prior cholecystectomy.  There is some strandiness of
the peripancreatic fat planes extending into the anterior pararenal
spaces, most consistent with mild pancreatitis.  No pseudocyst is
seen and the pancreatic duct is not dilated.  The adrenal glands
and spleen are unremarkable.  The stomach is not well distended.
The kidneys enhance with no calculus or mass and no hydronephrosis
is seen.  The abdominal aorta is normal in caliber.

The mucosa of the right colon and the base of the cecum is somewhat
prominent.  This could be due to poor distention, but mucosal edema
is a consideration as with focal colitis and clinical correlation
is recommended.  A few small lymph nodes are  present within the
right pelvis.  The uterus is normal in size.  The urinary bladder
is unremarkable.  A small left ovarian cyst is present.  No bony
abnormality is seen.
IMPRESSION: 1.  Some indistinctness of the peripancreatic fat planes extending
into the  anterior pararenal spaces most likely represents mild
pancreatitis.  No pseudocyst is seen.
2.  Questionable mucosal edema of the right colon and cecum.
Possible focal colitis.

These findings were reviewed with Dr. Ole Erik Marken at [DATE] p.m. on
07/28/2010.

## 2014-09-02 NOTE — H&P (Signed)
PATIENT NAME:  JAYLE, SOLARZ MR#:  [id number] DATE OF BIRTH:  1971/02/11  DATE OF ADMISSION:  05/13/2012  IDENTIFYING INFORMATION: A 44 year old woman with a history of depression, previously treated as an outpatient, who presents to the hospital requesting detox from opiates.   CHIEF COMPLAINT: "I am tired of this."   HISTORY OF PRESENT ILLNESS: Information is obtained from the patient and the chart. She came to the Emergency Room requesting admission for detox from opiates. She has a plan to go to an inpatient rehabilitation center starting Monday but felt that she needed to get detoxed before she would be able to go there. The patient is reporting that she is using about 4 Vicodin or oxycodone per day and has been doing so for quite a long time. The dose has gradually escalated. She is getting it prescribed for her, but it sounds like she gets it from more than one physician in order to stay on her pain medicine. She says the opiate use has been causing major problems in her life in that she has not been able to function well in her role professionally or at home with her family, there has been family discord in the form of several family members criticizing her and shunning her because of her pill use. Additionally, the patient feels very bad and depressed about it and feels bad about herself. She feels sad a lot of the time, having crying spells. Energy low, general ability to enjoy things low. Poor sleep at night. Decreased appetite. Has had some passive suicidal thought without intention or plan. Denies any hallucinations. Denies that she is abusing any other drugs, denies that she uses IV drugs. She is not drinking. She had been seeing an outpatient psychiatrist previously but lost her health insurance when she lost her job, and so has not been able to go back for follow up.   PAST PSYCHIATRIC HISTORY: No previous psychiatric hospitalizations. No history of suicide attempts. She was seeing a  psychiatrist in [place] until she lost her job and since then has not been able to afford to keep up with it. She had been taking citalopram, and clonazepam Ambien in the past. She thought it may have been helpful for her anxiety and mood, although she disliked the Ambien because it caused memory problems. She does not report other psychiatric medicine. No history of psychosis. She has been to outpatient substance abuse treatment in the past through our facility.   SUBSTANCE ABUSE HISTORY: History seems to primarily only involve oral narcotic pain medicines. She has a history of endometriosis with an endometrioma that causes chronic pain. Previously, it was getting treated appropriately, but since she lost her insurance she has not been able to afford to stay on the medicine, which has increased her pain. She has not been able to stay consistently off the narcotics at all on her own. The withdrawal symptoms get too severe for her.   PAST MEDICAL HISTORY: The patient has endometriosis for which she has had surgery in the past. She had more recently been getting treated with steroid injections, but has not been able to afford them since losing her insurance. No other ongoing medical problems.   SOCIAL HISTORY: She is married, has 4 children, 3 of them living at home. 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 Her relationship with her husband and children remains good. Her relationship with her own mother is stressful.   CURRENT  MEDICATIONS: Vicodin and oxycodone prescribed and taking about 4 of them a day until coming into the hospital, Protonix 40 mg per day, Imodium 1 tablet every 4 hours as needed for diarrhea,  Zofran 1 tablet twice a day as needed for nausea.   ALLERGIES: PHENERGAN.   MENTAL STATUS EXAMINATION: Casually dressed, neatly groomed woman, cooperative with treatment. Eye contact good. Psychomotor activity rigid and limited. Affect tearful, flat, blunted.  Speech decreased in total amount and somewhat quiet. Mood stated as depressed. Thoughts lucid. No loosening of associations. No evidence of delusions. Denies auditory or visual hallucinations. Denies suicidal or homicidal ideation currently but has recently had some passive suicidal thoughts. Judgment and insight good. Intelligence normal. Short and long-term memory grossly intact.   PHYSICAL EXAMINATION: GENERAL: A thin woman, looks her stated age. It looks like she is feeling a little bit shaky and cold.  HEENT: Pupils are equal and reactive. Face symmetric. Oral mucosa normal. No acute skin lesions. Neck is flexible.  MUSCULOSKELETAL: Full range of motion at all extremities. Normal gait. Strength and reflexes normal throughout.  NEUROLOGICAL: Cranial nerves symmetric.  LUNGS: Clear with no wheezes.  HEART: Regular rate and rhythm.  ABDOMEN: Soft, nontender, normal bowel sounds.  VITAL SIGNS: Admission blood pressure 134/88, respirations 18, pulse 79, temperature 97.4, has had low blood pressure since then after taking clonidine.  LABORATORY RESULTS: Drug screen positive for opiates and benzodiazepines. Chemistry panel normal. Alcohol undetected. CBC normal. TSH normal.   ASSESSMENT: This is a 44 year old woman with opiate dependence and depression, seeking opiate detox and stabilization prior to going to inpatient rehab. She has good insight. She is currently having opiate withdrawal symptoms, still very depressed and dysphoric. Not eating well. Needs hospitalization for stabilization of depression as well as assistance with detox.   TREATMENT PLAN: We discussed options for detox, and we will try and use clonidine if it does not drop her blood pressure too low. She also has p.r.n. Zofran and Imodium as well as Robaxin ordered. I suggest we restart her on Celexa for her depression. Daily monitoring of vital signs and daily involvement in individual and group therapy. Work with the substance abuse  counselor here on firming up her referral for outpatient treatment.   DIAGNOSIS, PRINCIPAL AND PRIMARY:  AXIS I: Depression, not otherwise specified.   SECONDARY DIAGNOSES: AXIS I: Opiate dependence.   AXIS II: Deferred.   AXIS III: Chronic pain.   AXIS IV: Moderate-to-severe from the stress of her opiate use, being out of work, losing her insurance.   AXIS V: Functioning at time of admission 40.   ____________________________ Audery AmelJohn T. Riggin Cuttino, MD jtc:cb D: 05/14/2012 15:43:04 ET T: 05/14/2012 16:55:11 ET JOB#: [id number]  cc: Audery AmelJohn T. Devine Dant, MD, <Dictator> Audery AmelJOHN T Thelda Gagan MD ELECTRONICALLY SIGNED 05/14/2012 17:56

## 2014-09-02 NOTE — Discharge Summary (Signed)
DATE OF BIRTH:  [birth year]  HOSPITAL COURSE:  See dictated history and physical for details of admission. This is 44 year old woman came to the hospital voluntarily seeking detox from opiates, also complaining of anxiety. She had a plan already in place to go to an inpatient rehab facility called [facility] starting on January 6th. She felt she needed to be detoxed off her prescription narcotics prior to going there. In the hospital, she was treated with palliative medicines for opiate withdrawal symptoms including clonidine, Lomotil, Robaxin, Zofran and Flexeril. She initially had significant complaints of pain, achiness, jitteriness and GI upset, but those have resolved. The patient still complains of muscle achiness and has relied on Flexeril. We tried to get her to use the Robaxin, but she reported that it was of no benefit. The patient has been educated about the potential misuse of Flexeril and that this may or may not be a medicine she can continue at an outpatient substance abuse facility. She understands that. She has not engaged in any dangerous or suicidal behavior in the hospital. Her mood and affect have generally been anxious, and she has been treated with citalopram, which she has tolerated well. She has been educated and involved in groups about treatment of mood and anxiety problems longterm and is agreeable to continuing with treatment and understands that may be part of staying sober. At this point, she is physically stable, has no suicidal ideation and appears to be mentally stable with an established plan to go to an inpatient substance abuse facility residentially starting tomorrow. As agreed upon previously, she will be discharged today. She was educated at discharge about the nature of substance abuse treatment and the importance of staying motivated and staying involved in treatment. She is understanding and agreeable to all this. Also, I will mention that we have treated her sleep  problem with Seroquel. I tried to get her to take trazodone, and she reported that it gave her a restless leg sort of feeling, so that has not been continued.   DISCHARGE MEDICATIONS: 1.  Quetiapine 100 mg at bedtime.  2.  Flexeril 10 mg q. 6 hours p.r.n. for muscle pain.  3.  Celexa 20 mg per day.   LABORATORY RESULTS:  Admission labs showed a drug screen positive for opiates and benzodiazepines. Chemistry panel normal. Alcohol undetected. TSH normal at 1.5. CBC entirely normal. Acetaminophen and salicylates low and nontoxic.   MENTAL STATUS EXAMINATION AT DISCHARGE:  Neatly dressed and groomed woman who looks her stated age. Cooperative with the interview. Good eye contact. Psychomotor activity still a little bit slow. Affect a little bit anxious and blunted. Mood stated as being worried. Thoughts are lucid without loosening of associations or sign of psychotic thinking. Denies auditory or visual hallucinations. Denies any suicidal or homicidal ideation. She expresses optimism and has positive things she is interested in in the future and is motivated. Alert and oriented x 4. Intelligence normal. Judgment and insight adequate. Short and long term memory intact.   DISPOSITION:  Discharge home with plans that she will follow up admitting himself to [facility] tomorrow.   DIAGNOSIS PRINCIPLE AND PRIMARY:   AXIS I:  Opiate dependence.   SECONDARY DIAGNOSIS:  AXIS I:  Anxiety disorder, not otherwise specified.  AXIS II:  Deferred.  AXIS III:  History of chronic pain from endometriosis.  AXIS IV:  Moderate to severe from being out of work, stress of illness.  AXIS V:  Functioning at time of discharge 55.  ____________________________ Audery Amel, MD jtc:ms D: 05/17/2012 13:23:27 ET T: 05/17/2012 20:31:39 ET JOB#: [id number]  cc: Audery Amel, MD, <Dictator> Audery Amel MD ELECTRONICALLY SIGNED 05/17/2012 22:18

## 2015-02-26 ENCOUNTER — Emergency Department: Payer: PRIVATE HEALTH INSURANCE

## 2015-02-26 ENCOUNTER — Emergency Department
Admission: EM | Admit: 2015-02-26 | Discharge: 2015-02-26 | Disposition: A | Discharge status: Home / Self Care | Payer: PRIVATE HEALTH INSURANCE | Attending: Emergency Medicine | Admitting: Emergency Medicine

## 2015-02-26 DIAGNOSIS — R062 Wheezing: Secondary | ICD-10-CM

## 2015-02-26 DIAGNOSIS — Z79899 Other long term (current) drug therapy: Secondary | ICD-10-CM | POA: Insufficient documentation

## 2015-02-26 DIAGNOSIS — Z720 Tobacco use: Secondary | ICD-10-CM | POA: Insufficient documentation

## 2015-02-26 DIAGNOSIS — J069 Acute upper respiratory infection, unspecified: Secondary | ICD-10-CM | POA: Insufficient documentation

## 2015-02-26 LAB — CBC WITH DIFFERENTIAL/PLATELET
BASOS ABS: 0.1 10*3/uL (ref 0–0.1)
Basophils Relative: 1 %
Eosinophils Absolute: 0.5 10*3/uL (ref 0–0.7)
Eosinophils Relative: 10 %
HEMATOCRIT: 39.7 % (ref 35.0–47.0)
HEMOGLOBIN: 13.4 g/dL (ref 12.0–16.0)
LYMPHS PCT: 22 %
Lymphs Abs: 1.2 10*3/uL (ref 1.0–3.6)
MCH: 28.8 pg (ref 26.0–34.0)
MCHC: 33.9 g/dL (ref 32.0–36.0)
MCV: 84.9 fL (ref 80.0–100.0)
MONO ABS: 0.4 10*3/uL (ref 0.2–0.9)
Monocytes Relative: 7 %
NEUTROS ABS: 3.1 10*3/uL (ref 1.4–6.5)
NEUTROS PCT: 60 %
Platelets: 152 10*3/uL (ref 150–440)
RBC: 4.67 MIL/uL (ref 3.80–5.20)
RDW: 14.4 % (ref 11.5–14.5)
WBC: 5.3 10*3/uL (ref 3.6–11.0)

## 2015-02-26 LAB — BASIC METABOLIC PANEL
ANION GAP: 7 (ref 5–15)
BUN: 10 mg/dL (ref 6–20)
CALCIUM: 8.9 mg/dL (ref 8.9–10.3)
CO2: 28 mmol/L (ref 22–32)
CREATININE: 0.54 mg/dL (ref 0.44–1.00)
Chloride: 103 mmol/L (ref 101–111)
Glucose, Bld: 139 mg/dL — ABNORMAL HIGH (ref 65–99)
Potassium: 3.1 mmol/L — ABNORMAL LOW (ref 3.5–5.1)
SODIUM: 138 mmol/L (ref 135–145)

## 2015-02-26 MED ORDER — PREDNISONE 20 MG PO TABS: 40.0000 mg | ORAL_TABLET | Freq: Every day | ORAL | Status: DC

## 2015-02-26 MED ORDER — IPRATROPIUM-ALBUTEROL 0.5-2.5 (3) MG/3ML IN SOLN
3.0000 mL | Freq: Once | RESPIRATORY_TRACT | Status: AC
  Administered 2015-02-26: 3 mL via RESPIRATORY_TRACT
  Filled 2015-02-26: qty 3

## 2015-02-26 MED ORDER — IPRATROPIUM-ALBUTEROL 0.5-2.5 (3) MG/3ML IN SOLN
RESPIRATORY_TRACT | Status: AC
  Administered 2015-02-26: 3 mL via RESPIRATORY_TRACT
  Filled 2015-02-26: qty 3

## 2015-02-26 MED ORDER — MAGNESIUM SULFATE 2 GM/50ML IV SOLN
2.0000 g | Freq: Once | INTRAVENOUS | Status: AC
  Administered 2015-02-26: 2 g via INTRAVENOUS
  Filled 2015-02-26: qty 50

## 2015-02-26 MED ORDER — ALBUTEROL SULFATE HFA 108 (90 BASE) MCG/ACT IN AERS: 2.0000 | INHALATION_SPRAY | Freq: Four times a day (QID) | RESPIRATORY_TRACT | Status: AC | PRN

## 2015-02-26 MED ORDER — METHYLPREDNISOLONE SODIUM SUCC 125 MG IJ SOLR
125.0000 mg | Freq: Once | INTRAMUSCULAR | Status: AC
  Administered 2015-02-26: 125 mg via INTRAVENOUS
  Filled 2015-02-26: qty 2

## 2015-02-26 MED ORDER — IPRATROPIUM-ALBUTEROL 0.5-2.5 (3) MG/3ML IN SOLN
3.0000 mL | Freq: Once | RESPIRATORY_TRACT | Status: AC
  Administered 2015-02-26: 3 mL via RESPIRATORY_TRACT

## 2015-02-26 MED ORDER — PREDNISONE 20 MG PO TABS: 40.0000 mg | ORAL_TABLET | Freq: Every day | ORAL | Status: AC

## 2015-02-26 MED ORDER — ALBUTEROL SULFATE HFA 108 (90 BASE) MCG/ACT IN AERS: 2.0000 | INHALATION_SPRAY | Freq: Four times a day (QID) | RESPIRATORY_TRACT | Status: DC | PRN

## 2015-02-26 NOTE — Discharge Instructions (Signed)
Please seek medical attention for any high fevers, chest pain, shortness of breath, change in behavior, persistent vomiting, bloody stool or any other new or concerning symptoms. ° ° °Upper Respiratory Infection, Adult °Most upper respiratory infections (URIs) are a viral infection of the air passages leading to the lungs. A URI affects the nose, throat, and upper air passages. The most common type of URI is nasopharyngitis and is typically referred to as "the common cold." °URIs run their course and usually go away on their own. Most of the time, a URI does not require medical attention, but sometimes a bacterial infection in the upper airways can follow a viral infection. This is called a secondary infection. Sinus and middle ear infections are common types of secondary upper respiratory infections. °Bacterial pneumonia can also complicate a URI. A URI can worsen asthma and chronic obstructive pulmonary disease (COPD). Sometimes, these complications can require emergency medical care and may be life threatening.  °CAUSES °Almost all URIs are caused by viruses. A virus is a type of germ and can spread from one person to another.  °RISKS FACTORS °You may be at risk for a URI if:  °· You smoke.   °· You have chronic heart or lung disease. °· You have a weakened defense (immune) system.   °· You are very young or very old.   °· You have nasal allergies or asthma. °· You work in crowded or poorly ventilated areas. °· You work in health care facilities or schools. °SIGNS AND SYMPTOMS  °Symptoms typically develop 2-3 days after you come in contact with a cold virus. Most viral URIs last 7-10 days. However, viral URIs from the influenza virus (flu virus) can last 14-18 days and are typically more severe. Symptoms may include:  °· Runny or stuffy (congested) nose.   °· Sneezing.   °· Cough.   °· Sore throat.   °· Headache.   °· Fatigue.   °· Fever.   °· Loss of appetite.   °· Pain in your forehead, behind your eyes, and  over your cheekbones (sinus pain). °· Muscle aches.   °DIAGNOSIS  °Your health care provider may diagnose a URI by: °· Physical exam. °· Tests to check that your symptoms are not due to another condition such as: °¨ Strep throat. °¨ Sinusitis. °¨ Pneumonia. °¨ Asthma. °TREATMENT  °A URI goes away on its own with time. It cannot be cured with medicines, but medicines may be prescribed or recommended to relieve symptoms. Medicines may help: °· Reduce your fever. °· Reduce your cough. °· Relieve nasal congestion. °HOME CARE INSTRUCTIONS  °· Take medicines only as directed by your health care provider.   °· Gargle warm saltwater or take cough drops to comfort your throat as directed by your health care provider. °· Use a warm mist humidifier or inhale steam from a shower to increase air moisture. This may make it easier to breathe. °· Drink enough fluid to keep your urine clear or pale yellow.   °· Eat soups and other clear broths and maintain good nutrition.   °· Rest as needed.   °· Return to work when your temperature has returned to normal or as your health care provider advises. You may need to stay home longer to avoid infecting others. You can also use a face mask and careful hand washing to prevent spread of the virus. °· Increase the usage of your inhaler if you have asthma.   °· Do not use any tobacco products, including cigarettes, chewing tobacco, or electronic cigarettes. If you need help quitting, ask your health care   provider. °PREVENTION  °The best way to protect yourself from getting a cold is to practice good hygiene.  °· Avoid oral or hand contact with people with cold symptoms.   °· Wash your hands often if contact occurs.   °There is no clear evidence that vitamin C, vitamin E, echinacea, or exercise reduces the chance of developing a cold. However, it is always recommended to get plenty of rest, exercise, and practice good nutrition.  °SEEK MEDICAL CARE IF:  °· You are getting worse rather than  better.   °· Your symptoms are not controlled by medicine.   °· You have chills. °· You have worsening shortness of breath. °· You have brown or red mucus. °· You have yellow or brown nasal discharge. °· You have pain in your face, especially when you bend forward. °· You have a fever. °· You have swollen neck glands. °· You have pain while swallowing. °· You have white areas in the back of your throat. °SEEK IMMEDIATE MEDICAL CARE IF:  °· You have severe or persistent: °¨ Headache. °¨ Ear pain. °¨ Sinus pain. °¨ Chest pain. °· You have chronic lung disease and any of the following: °¨ Wheezing. °¨ Prolonged cough. °¨ Coughing up blood. °¨ A change in your usual mucus. °· You have a stiff neck. °· You have changes in your: °¨ Vision. °¨ Hearing. °¨ Thinking. °¨ Mood. °MAKE SURE YOU:  °· Understand these instructions. °· Will watch your condition. °· Will get help right away if you are not doing well or get worse. °  °This information is not intended to replace advice given to you by your health care provider. Make sure you discuss any questions you have with your health care provider. °  °Document Released: 10/23/2000 Document Revised: 09/13/2014 Document Reviewed: 08/04/2013 °Elsevier Interactive Patient Education ©2016 [employer] ° °

## 2015-02-26 NOTE — ED Provider Notes (Signed)
[facility] Emergency Department Provider Note   ____________________________________________  Time seen: 1055  I have reviewed the triage vital signs and the nursing notes.   HISTORY  Chief Complaint Shortness of Breath   History limited by: Not Limited   HPI [name] is a 44 y.o. femalewith history of smoking who presents to the emergency department today because of shortness of breath. She states that she started having shortness of breath 2 days ago. It has gradually gotten worse. It has been accompanied by a cough. She does state however that she has had a cough for a number of weeks. It has lately though been productive of greenish phlegm. Furthermore the patient does state she has been having some left-sided chest pain the past couple of days. She has also had a fever with the maximum temperature of 102. She denies any significant pneumonias in the past. She denies any history of asthma or COPD. She does state that she is a smoker.     Past Medical History  Diagnosis Date  . S/P laparoscopic cholecystectomy   . Clotting disorder   . Depression   . Anxiety   . Blood dyscrasia     hx of clotting disorder  . Ovarian cyst     Patient Active Problem List   Diagnosis Date Noted  . Diarrhea 08/12/2011  . Nausea and vomiting 08/04/2011  . Abdominal pain of unknown etiology 08/04/2011  . Hypokalemia 08/04/2011  . Dehydration 08/04/2011  . Ovarian cystic mass 08/04/2011  . Hyponatremia 08/04/2011  . Back pain 08/04/2011  . Scoliosis 08/04/2011    Past Surgical History  Procedure Laterality Date  . Abdominal hysterectomy    . Cholecystectomy    . Ceasarean      Current Outpatient Rx  Name  Route  Sig  Dispense  Refill  . ALPRAZolam (XANAX) 0.25 MG tablet   Oral   Take 0.25 mg by mouth 2 (two) times daily as needed. For anxiety         . citalopram (CELEXA) 20 MG tablet   Oral   Take 1 tablet (20 mg total) by mouth daily.   15  tablet   0   . diphenhydrAMINE (SOMINEX) 25 MG tablet   Oral   Take 25 mg by mouth daily as needed. For sleep         . HYDROcodone-acetaminophen (NORCO/VICODIN) 5-325 MG per tablet   Oral   Take 1 tablet by mouth 2 (two) times daily as needed. For lower quadrant pain         . ibuprofen (ADVIL,MOTRIN) 200 MG tablet   Oral   Take 600 mg by mouth every 6 (six) hours as needed. For lower abdomen pain         . Melatonin 5 MG TABS   Oral   Take 1 tablet by mouth.         . EXPIRED: metoCLOPramide (REGLAN) 5 MG tablet   Oral   Take 1 tablet (5 mg total) by mouth every 8 (eight) hours as needed.         . ondansetron (ZOFRAN) 8 MG tablet   Oral   Take 4 mg by mouth every 8 (eight) hours as needed. For nausea         . oxycodone (OXY-IR) 5 MG capsule   Oral   Take 5 mg by mouth every 6 (six) hours as needed. For lower quadrant pain  Allergies Promethazine hcl  No family history on file.  Social History Social History  Substance Use Topics  . Smoking status: Current Every Day Smoker -- 2.00 packs/day for 20 years    Types: Cigarettes  . Smokeless tobacco: Never Used  . Alcohol Use: No    Review of Systems  Constitutional: Negative for fever. Cardiovascular: Positive for chest pain. Respiratory: Positive for shortness of breath. Gastrointestinal: Negative for abdominal pain, vomiting and diarrhea. Genitourinary: Negative for dysuria. Musculoskeletal: Negative for back pain. Skin: Negative for rash. Neurological: Negative for headaches, focal weakness or numbness.   10-point ROS otherwise negative.  ____________________________________________   PHYSICAL EXAM:  VITAL SIGNS: ED Triage Vitals  Enc Vitals Group     BP 02/26/15 1033 130/87 mmHg     Pulse Rate 02/26/15 1033 74     Resp --      Temp 02/26/15 1033 98.8 F (37.1 C)     Temp Source 02/26/15 1033 Oral     SpO2 02/26/15 1033 93 %     Weight 02/26/15 1033 165 lb (74.844  kg)     Height 02/26/15 1033  (1.727 m)     Head Cir --      Peak Flow --      Pain Score 02/26/15 1035 7   Constitutional: Alert and oriented. Well appearing and in no distress. Eyes: Conjunctivae are normal. PERRL. Normal extraocular movements. ENT   Head: Normocephalic and atraumatic.   Nose: No congestion/rhinnorhea.   Mouth/Throat: Mucous membranes are moist.   Neck: No stridor. Hematological/Lymphatic/Immunilogical: No cervical lymphadenopathy. Cardiovascular: Normal rate, regular rhythm.  No murmurs, rubs, or gallops. Respiratory: Increased respiratory effort. Bilateral expiratory wheezing. Gastrointestinal: Soft and nontender. No distention.  Genitourinary: Deferred Musculoskeletal: Normal range of motion in all extremities. No joint effusions.  No lower extremity tenderness nor edema. Neurologic:  Normal speech and language. No gross focal neurologic deficits are appreciated. Speech is normal.  Skin:  Skin is warm, dry and intact. No rash noted. Psychiatric: Mood and affect are normal. Speech and behavior are normal. Patient exhibits appropriate insight and judgment.  ____________________________________________    LABS (pertinent positives/negatives)  Labs Reviewed  BASIC METABOLIC PANEL - Abnormal; Notable for the following:    Potassium 3.1 (*)    Glucose, Bld 139 (*)    All other components within normal limits  CBC WITH DIFFERENTIAL/PLATELET     ____________________________________________  EKG  I, Phineas Semen, attending physician, personally viewed and interpreted this EKG  EKG Time: 1039 Rate: 73 Rhythm: NSR Axis: normal Intervals: qtc 475 QRS: narrow ST changes: no st elevation Impression: normal ekg ____________________________________________    RADIOLOGY  CXR  IMPRESSION: No radiographic evidence of acute cardiopulmonary disease.  I, Cherisa Brucker, personally viewed and evaluated these images (plain radiographs)  as part of my medical decision making. ____________________________________________   PROCEDURES  Procedure(s) performed: None  Critical Care performed: No  ____________________________________________   INITIAL IMPRESSION / ASSESSMENT AND PLAN / ED COURSE  Pertinent labs & imaging results that were available during my care of the patient were reviewed by me and considered in my medical decision making (see chart for details).  Patient presents to the emergency department today because of difficulty with breathing. On exam patient does have extensive wheezing. Patient does have a history of smoking. The patient did feel better after DuoNeb's, magnesium and steroids. Chest x-ray without any signs of pneumonia. I do think this is all likely reactive airway disease. Will give patient albuterol  inhaler and prednisone prescription.  ____________________________________________   FINAL CLINICAL IMPRESSION(S) / ED DIAGNOSES  Final diagnoses:  URI (upper respiratory infection)  Wheezing     Phineas Semen, MD 02/26/15 1520

## 2015-02-26 NOTE — ED Notes (Signed)
Patient transported to X-ray 

## 2015-02-26 NOTE — ED Notes (Signed)
Pt reports SOB since Friday coughing green sputum. Wheezing and increased SOB with talking. Pt reports a TMax 102

## 2015-12-07 ENCOUNTER — Encounter: Payer: Self-pay | Admitting: Urgent Care

## 2015-12-07 DIAGNOSIS — E876 Hypokalemia: Secondary | ICD-10-CM | POA: Insufficient documentation

## 2015-12-07 DIAGNOSIS — R112 Nausea with vomiting, unspecified: Secondary | ICD-10-CM | POA: Insufficient documentation

## 2015-12-07 DIAGNOSIS — Z79899 Other long term (current) drug therapy: Secondary | ICD-10-CM | POA: Insufficient documentation

## 2015-12-07 DIAGNOSIS — F17210 Nicotine dependence, cigarettes, uncomplicated: Secondary | ICD-10-CM | POA: Insufficient documentation

## 2015-12-07 DIAGNOSIS — R1032 Left lower quadrant pain: Secondary | ICD-10-CM | POA: Insufficient documentation

## 2015-12-07 NOTE — ED Triage Notes (Signed)
Patient presents with c/o LEFT lower back pain with (+) radiation into flank since yesterday. (+) N/V reported. Denies dysuria.

## 2015-12-08 ENCOUNTER — Emergency Department: Payer: PRIVATE HEALTH INSURANCE

## 2015-12-08 ENCOUNTER — Emergency Department
Admission: EM | Admit: 2015-12-08 | Discharge: 2015-12-08 | Disposition: A | Discharge status: Home / Self Care | Payer: PRIVATE HEALTH INSURANCE | Attending: Emergency Medicine | Admitting: Emergency Medicine

## 2015-12-08 DIAGNOSIS — E876 Hypokalemia: Secondary | ICD-10-CM

## 2015-12-08 DIAGNOSIS — R109 Unspecified abdominal pain: Secondary | ICD-10-CM

## 2015-12-08 LAB — BASIC METABOLIC PANEL
Anion gap: 9 (ref 5–15)
BUN: 15 mg/dL (ref 6–20)
CO2: 27 mmol/L (ref 22–32)
Calcium: 9.7 mg/dL (ref 8.9–10.3)
Chloride: 103 mmol/L (ref 101–111)
Creatinine, Ser: 0.84 mg/dL (ref 0.44–1.00)
GFR calc Af Amer: >60 mL/min (ref >60)
GLUCOSE: 120 mg/dL — AB (ref 65–99)
POTASSIUM: 3 mmol/L — AB (ref 3.5–5.1)
SODIUM: 139 mmol/L (ref 135–145)

## 2015-12-08 LAB — URINALYSIS COMPLETE WITH MICROSCOPIC (ARMC ONLY)
BILIRUBIN URINE: NEGATIVE
GLUCOSE, UA: NEGATIVE mg/dL
KETONES UR: NEGATIVE mg/dL
Leukocytes, UA: NEGATIVE
NITRITE: NEGATIVE
Protein, ur: 100 mg/dL — AB
SPECIFIC GRAVITY, URINE: 1.026 (ref 1.005–1.030)
pH: 5 (ref 5.0–8.0)

## 2015-12-08 LAB — CBC WITH DIFFERENTIAL/PLATELET
Basophils Absolute: 0 10*3/uL (ref 0–0.1)
Basophils Relative: 1 %
EOS ABS: 0.1 10*3/uL (ref 0–0.7)
EOS PCT: 1 %
HCT: 42.5 % (ref 35.0–47.0)
Hemoglobin: 14.8 g/dL (ref 12.0–16.0)
LYMPHS ABS: 3 10*3/uL (ref 1.0–3.6)
LYMPHS PCT: 36 %
MCH: 29 pg (ref 26.0–34.0)
MCHC: 34.9 g/dL (ref 32.0–36.0)
MCV: 83.2 fL (ref 80.0–100.0)
MONO ABS: 0.6 10*3/uL (ref 0.2–0.9)
MONOS PCT: 7 %
Neutro Abs: 4.7 10*3/uL (ref 1.4–6.5)
Neutrophils Relative %: 55 %
PLATELETS: 202 10*3/uL (ref 150–440)
RBC: 5.1 MIL/uL (ref 3.80–5.20)
RDW: 14.2 % (ref 11.5–14.5)
WBC: 8.3 10*3/uL (ref 3.6–11.0)

## 2015-12-08 MED ORDER — POTASSIUM CHLORIDE CRYS ER 20 MEQ PO TBCR
40.0000 meq | EXTENDED_RELEASE_TABLET | Freq: Once | ORAL | Status: AC
  Administered 2015-12-08: 40 meq via ORAL
  Filled 2015-12-08: qty 2

## 2015-12-08 MED ORDER — ONDANSETRON HCL 4 MG/2ML IJ SOLN
4.0000 mg | Freq: Once | INTRAMUSCULAR | Status: AC
  Administered 2015-12-08: 4 mg via INTRAVENOUS
  Filled 2015-12-08: qty 2

## 2015-12-08 MED ORDER — HYDROMORPHONE HCL 1 MG/ML IJ SOLN
INTRAMUSCULAR | Status: AC
  Administered 2015-12-08: 1 mg via INTRAVENOUS
  Filled 2015-12-08: qty 1

## 2015-12-08 MED ORDER — HYDROMORPHONE HCL 1 MG/ML IJ SOLN
1.0000 mg | Freq: Once | INTRAMUSCULAR | Status: AC
  Administered 2015-12-08: 1 mg via INTRAVENOUS

## 2015-12-08 MED ORDER — OXYCODONE-ACETAMINOPHEN 5-325 MG PO TABS: 1.0000 | ORAL_TABLET | ORAL | 0 refills | Status: DC | PRN

## 2015-12-08 MED ORDER — OXYCODONE-ACETAMINOPHEN 5-325 MG PO TABS
1.0000 | ORAL_TABLET | Freq: Once | ORAL | Status: AC
  Administered 2015-12-08: 1 via ORAL
  Filled 2015-12-08: qty 2

## 2015-12-08 MED ORDER — OXYCODONE-ACETAMINOPHEN 5-325 MG PO TABS
1.0000 | ORAL_TABLET | Freq: Once | ORAL | Status: AC
  Administered 2015-12-08: 1 via ORAL

## 2015-12-08 MED ORDER — HYDROMORPHONE HCL 1 MG/ML PO LIQD
1.0000 mg | Freq: Once | ORAL | Status: DC
Start: 2015-12-08 — End: 2015-12-08

## 2015-12-08 MED ORDER — KETOROLAC TROMETHAMINE 30 MG/ML IJ SOLN
30.0000 mg | Freq: Once | INTRAMUSCULAR | Status: AC
  Administered 2015-12-08: 30 mg via INTRAVENOUS
  Filled 2015-12-08: qty 1

## 2015-12-08 MED ORDER — IBUPROFEN 800 MG PO TABS: 800.0000 mg | ORAL_TABLET | Freq: Three times a day (TID) | ORAL | 0 refills | Status: AC | PRN

## 2015-12-08 NOTE — Discharge Instructions (Signed)
1. You may take pain medicines as needed (Motrin/Percocet #15). 2. Drink plenty of bottled or filtered water daily. 3. Return to the ER for worsening symptoms, persistent vomiting, difficulty breathing or other concerns.

## 2015-12-08 NOTE — ED Provider Notes (Signed)
[facility] Emergency Department Provider Note   ____________________________________________   First MD Initiated Contact with Patient 12/08/15 0315     (approximate)  I have reviewed the triage vital signs and the nursing notes.   HISTORY  Chief Complaint Flank Pain and Nausea    HPI [name] is a 45 y.o. female who presents to the ED from home with a chief complaint of left flank pain.Patient reports sudden onset of left flank pain since yesterday. Symptoms associated with radiation to her left lower quadrant, nausea and vomiting. Patient has not had similar symptoms previously. Denies history of kidney stone although it does run in her family. Denies associated fever, chills, chest pain, shortness of breath, dysuria. Denies recent travel or trauma. Nothing makes her symptoms better or worse.   Past Medical History:  Diagnosis Date  . Anxiety   . Blood dyscrasia    hx of clotting disorder  . Clotting disorder (HCC)   . Depression   . Ovarian cyst   . S/P laparoscopic cholecystectomy     Patient Active Problem List   Diagnosis Date Noted  . Diarrhea 08/12/2011  . Nausea and vomiting 08/04/2011  . Abdominal pain of unknown etiology 08/04/2011  . Hypokalemia 08/04/2011  . Dehydration 08/04/2011  . Ovarian cystic mass 08/04/2011  . Hyponatremia 08/04/2011  . Back pain 08/04/2011  . Scoliosis 08/04/2011    Past Surgical History:  Procedure Laterality Date  . ABDOMINAL HYSTERECTOMY    . CEASAREAN    . CHOLECYSTECTOMY      Prior to Admission medications   Medication Sig Start Date End Date Taking? Authorizing Provider  citalopram (CELEXA) 20 MG tablet Take 1 tablet (20 mg total) by mouth daily. 05/13/12  Yes Nicole Pisciotta, PA-C  QUEtiapine (SEROQUEL) 100 MG tablet Take 100 mg by mouth at bedtime.   Yes Historical Provider, MD  albuterol (PROVENTIL HFA;VENTOLIN HFA) 108 (90 BASE) MCG/ACT inhaler Inhale 2 puffs into the lungs every 6  (six) hours as needed for wheezing or shortness of breath. 02/26/15   Phineas Semen, MD  albuterol (PROVENTIL HFA;VENTOLIN HFA) 108 (90 BASE) MCG/ACT inhaler Inhale 2 puffs into the lungs every 6 (six) hours as needed for wheezing or shortness of breath. 02/26/15   Phineas Semen, MD  ALPRAZolam Prudy Feeler) 0.25 MG tablet Take 0.25 mg by mouth 2 (two) times daily as needed. For anxiety    Historical Provider, MD  diphenhydrAMINE (SOMINEX) 25 MG tablet Take 25 mg by mouth daily as needed. For sleep    Historical Provider, MD  HYDROcodone-acetaminophen (NORCO/VICODIN) 5-325 MG per tablet Take 1 tablet by mouth 2 (two) times daily as needed. For lower quadrant pain    Historical Provider, MD  ibuprofen (ADVIL,MOTRIN) 800 MG tablet Take 1 tablet (800 mg total) by mouth every 8 (eight) hours as needed for moderate pain. 12/08/15   Irean Hong, MD  Melatonin 5 MG TABS Take 1 tablet by mouth.    Historical Provider, MD  metoCLOPramide (REGLAN) 5 MG tablet Take 1 tablet (5 mg total) by mouth every 8 (eight) hours as needed. 08/14/11 08/24/11  Tora Kindred York, PA-C  ondansetron (ZOFRAN) 8 MG tablet Take 4 mg by mouth every 8 (eight) hours as needed. For nausea    Historical Provider, MD  oxycodone (OXY-IR) 5 MG capsule Take 5 mg by mouth every 6 (six) hours as needed. For lower quadrant pain    Historical Provider, MD  oxyCODONE-acetaminophen (ROXICET) 5-325 MG tablet Take 1  tablet by mouth every 4 (four) hours as needed for severe pain. 12/08/15   Irean Hong, MD  predniSONE (DELTASONE) 20 MG tablet Take 2 tablets (40 mg total) by mouth daily. 02/26/15 02/26/16  Phineas Semen, MD    Allergies Promethazine hcl  Family history Father with nephrolithiasis  Social History Social History  Substance Use Topics  . Smoking status: Current Every Day Smoker    Packs/day: 2.00    Years: 20.00    Types: Cigarettes  . Smokeless tobacco: Never Used  . Alcohol use No    Review of Systems  Constitutional: No  fever/chills. Eyes: No visual changes. ENT: No sore throat. Cardiovascular: Denies chest pain. Respiratory: Denies shortness of breath. Gastrointestinal: Positive for left flank and left lower quadrant abdominal pain.  Positive for vomiting.  No diarrhea.  No constipation. Genitourinary: Negative for dysuria. Musculoskeletal: Negative for back pain. Skin: Negative for rash. Neurological: Negative for headaches, focal weakness or numbness.  10-point ROS otherwise negative.  ____________________________________________   PHYSICAL EXAM:  VITAL SIGNS: ED Triage Vitals  Enc Vitals Group     BP 12/07/15 2309 123/81     Pulse Rate 12/07/15 2309 93     Resp 12/07/15 2309 20     Temp 12/07/15 2309 98 F (36.7 C)     Temp Source 12/07/15 2309 Oral     SpO2 12/07/15 2309 97 %     Weight 12/07/15 2309 175 lb (79.4 kg)     Height 12/07/15 2309  (1.727 m)     Head Circumference --      Peak Flow --      Pain Score 12/07/15 2324 10     Pain Loc --      Pain Edu? --      Excl. in GC? --     Constitutional: Alert and oriented. Well appearing and in mild acute distress. Eyes: Conjunctivae are normal. PERRL. EOMI. Head: Atraumatic. Nose: No congestion/rhinnorhea. Mouth/Throat: Mucous membranes are moist.  Oropharynx non-erythematous. Neck: No stridor.   Cardiovascular: Normal rate, regular rhythm. Grossly normal heart sounds.  Good peripheral circulation. Respiratory: Normal respiratory effort.  No retractions. Lungs CTAB. Gastrointestinal: Soft and mildly tender to palpation left lateral abdomen without rebound or guarding. No distention. No abdominal bruits. Mild left CVA tenderness. Musculoskeletal: No lower extremity tenderness nor edema.  No joint effusions. Neurologic:  Normal speech and language. No gross focal neurologic deficits are appreciated. No gait instability. Skin:  Skin is warm, dry and intact. No rash noted. No vesicles. Psychiatric: Mood and affect are normal.  Speech and behavior are normal.  ____________________________________________   LABS (all labs ordered are listed, but only abnormal results are displayed)  Labs Reviewed  URINALYSIS COMPLETEWITH MICROSCOPIC ([facility] ONLY) - Abnormal; Notable for the following:       Result Value   Color, Urine YELLOW (*)    APPearance HAZY (*)    Hgb urine dipstick 3+ (*)    Protein, ur 100 (*)    Bacteria, UA RARE (*)    Squamous Epithelial / LPF 0-5 (*)    All other components within normal limits  BASIC METABOLIC PANEL - Abnormal; Notable for the following:    Potassium 3.0 (*)    Glucose, Bld 120 (*)    All other components within normal limits  CBC WITH DIFFERENTIAL/PLATELET   ____________________________________________  EKG  None ____________________________________________  RADIOLOGY  CT renal stone study interpreted per Dr. Gwenyth Bender: No acute intra-abdominal pelvic pathology. Specifically there  is no hydronephrosis or nephrolithiasis. ____________________________________________   PROCEDURES  Procedure(s) performed: None  Procedures  Critical Care performed: No  ____________________________________________   INITIAL IMPRESSION / ASSESSMENT AND PLAN / ED COURSE  Pertinent labs & imaging results that were available during my care of the patient were reviewed by me and considered in my medical decision making (see chart for details).  45 year old female who presents with sudden onset left flank pain. Urinalysis notable for microscopic hematuria; CT scan negative for visible stones. Will check lab work for renal function. Patient's pain returned after IV Dilaudid. At this time will administer IV Toradol and oral Percocet.  Clinical Course  Comment By Time  Updated patient on laboratory results. Will replete potassium in the emergency department. Patient is overall feeling better and eager for discharge. Strict return precautions given. Patient and family verbalize  understanding and agree with plan of care. Irean Hong, MD 07/28 [phone]     ____________________________________________   FINAL CLINICAL IMPRESSION(S) / ED DIAGNOSES  Final diagnoses:  Left flank pain  Hypokalemia      NEW MEDICATIONS STARTED DURING THIS VISIT:  New Prescriptions   IBUPROFEN (ADVIL,MOTRIN) 800 MG TABLET    Take 1 tablet (800 mg total) by mouth every 8 (eight) hours as needed for moderate pain.   OXYCODONE-ACETAMINOPHEN (ROXICET) 5-325 MG TABLET    Take 1 tablet by mouth every 4 (four) hours as needed for severe pain.     Note:  This document was prepared using Dragon voice recognition software and may include unintentional dictation errors.    Irean Hong, MD 12/08/15 [phone]

## 2016-08-17 ENCOUNTER — Encounter: Payer: Self-pay | Admitting: Emergency Medicine

## 2016-08-17 ENCOUNTER — Emergency Department: Payer: PRIVATE HEALTH INSURANCE

## 2016-08-17 ENCOUNTER — Emergency Department
Admission: EM | Admit: 2016-08-17 | Discharge: 2016-08-17 | Disposition: A | Discharge status: Home / Self Care | Payer: PRIVATE HEALTH INSURANCE | Attending: Emergency Medicine | Admitting: Emergency Medicine

## 2016-08-17 DIAGNOSIS — S60222A Contusion of left hand, initial encounter: Secondary | ICD-10-CM | POA: Insufficient documentation

## 2016-08-17 DIAGNOSIS — Y9389 Activity, other specified: Secondary | ICD-10-CM | POA: Insufficient documentation

## 2016-08-17 DIAGNOSIS — Y929 Unspecified place or not applicable: Secondary | ICD-10-CM | POA: Insufficient documentation

## 2016-08-17 DIAGNOSIS — W231XXA Caught, crushed, jammed, or pinched between stationary objects, initial encounter: Secondary | ICD-10-CM | POA: Insufficient documentation

## 2016-08-17 DIAGNOSIS — Z87891 Personal history of nicotine dependence: Secondary | ICD-10-CM | POA: Insufficient documentation

## 2016-08-17 DIAGNOSIS — Y999 Unspecified external cause status: Secondary | ICD-10-CM | POA: Insufficient documentation

## 2016-08-17 MED ORDER — TRAMADOL HCL 50 MG PO TABS: 50.0000 mg | ORAL_TABLET | Freq: Four times a day (QID) | ORAL | 0 refills | Status: AC | PRN
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
## 2016-08-17 NOTE — ED Notes (Signed)
Pt. States at around 4 pm today crushed lt. Hand in door frame and door.  Bruising to top of lt. Hand. No deformity noted.

## 2016-08-17 NOTE — ED Triage Notes (Signed)
Pt. States lt. Hand crushed between door and door frame. Bruising to top of lt. Hand.

## 2016-10-21 ENCOUNTER — Ambulatory Visit: Payer: PRIVATE HEALTH INSURANCE | Attending: Oncology

## 2017-05-22 ENCOUNTER — Other Ambulatory Visit: Payer: Self-pay | Admitting: Family Medicine

## 2017-05-22 DIAGNOSIS — N6331 Unspecified lump in axillary tail of the right breast: Secondary | ICD-10-CM

## 2017-06-03 ENCOUNTER — Ambulatory Visit
Admission: RE | Admit: 2017-06-03 | Discharge: 2017-06-03 | Disposition: A | Discharge status: Home / Self Care | Payer: BLUE CROSS/BLUE SHIELD | Source: Ambulatory Visit | Attending: Family Medicine | Admitting: Family Medicine

## 2017-06-03 DIAGNOSIS — N6331 Unspecified lump in axillary tail of the right breast: Secondary | ICD-10-CM | POA: Insufficient documentation

## 2017-07-18 ENCOUNTER — Emergency Department: Payer: BLUE CROSS/BLUE SHIELD

## 2017-07-18 ENCOUNTER — Other Ambulatory Visit: Payer: Self-pay

## 2017-07-18 ENCOUNTER — Emergency Department
Admission: EM | Admit: 2017-07-18 | Discharge: 2017-07-18 | Disposition: A | Discharge status: Home / Self Care | Payer: BLUE CROSS/BLUE SHIELD | Attending: Emergency Medicine | Admitting: Emergency Medicine

## 2017-07-18 DIAGNOSIS — Z87891 Personal history of nicotine dependence: Secondary | ICD-10-CM | POA: Insufficient documentation

## 2017-07-18 DIAGNOSIS — E119 Type 2 diabetes mellitus without complications: Secondary | ICD-10-CM | POA: Diagnosis not present

## 2017-07-18 DIAGNOSIS — Z862 Personal history of diseases of the blood and blood-forming organs and certain disorders involving the immune mechanism: Secondary | ICD-10-CM | POA: Diagnosis not present

## 2017-07-18 DIAGNOSIS — Z8249 Family history of ischemic heart disease and other diseases of the circulatory system: Secondary | ICD-10-CM | POA: Insufficient documentation

## 2017-07-18 DIAGNOSIS — Z79899 Other long term (current) drug therapy: Secondary | ICD-10-CM | POA: Insufficient documentation

## 2017-07-18 DIAGNOSIS — M25562 Pain in left knee: Secondary | ICD-10-CM

## 2017-07-18 HISTORY — DX: Type 2 diabetes mellitus without complications: E11.9

## 2017-07-18 LAB — CBC WITH DIFFERENTIAL/PLATELET
BASOS ABS: 0 10*3/uL (ref 0–0.1)
Basophils Relative: 0 %
EOS PCT: 1 %
Eosinophils Absolute: 0.1 10*3/uL (ref 0–0.7)
HCT: 40.4 % (ref 35.0–47.0)
HEMOGLOBIN: 13.6 g/dL (ref 12.0–16.0)
LYMPHS ABS: 1.2 10*3/uL (ref 1.0–3.6)
LYMPHS PCT: 24 %
MCH: 29.4 pg (ref 26.0–34.0)
MCHC: 33.7 g/dL (ref 32.0–36.0)
MCV: 87.2 fL (ref 80.0–100.0)
Monocytes Absolute: 0.3 10*3/uL (ref 0.2–0.9)
Monocytes Relative: 7 %
NEUTROS ABS: 3.5 10*3/uL (ref 1.4–6.5)
NEUTROS PCT: 68 %
PLATELETS: 174 10*3/uL (ref 150–440)
RBC: 4.63 MIL/uL (ref 3.80–5.20)
RDW: 13.7 % (ref 11.5–14.5)
WBC: 5.1 10*3/uL (ref 3.6–11.0)

## 2017-07-18 LAB — BASIC METABOLIC PANEL
ANION GAP: 9 (ref 5–15)
BUN: 16 mg/dL (ref 6–20)
CHLORIDE: 102 mmol/L (ref 101–111)
CO2: 24 mmol/L (ref 22–32)
Calcium: 8.8 mg/dL — ABNORMAL LOW (ref 8.9–10.3)
Creatinine, Ser: 1.04 mg/dL — ABNORMAL HIGH (ref 0.44–1.00)
Glucose, Bld: 147 mg/dL — ABNORMAL HIGH (ref 65–99)
Potassium: 4.1 mmol/L (ref 3.5–5.1)
SODIUM: 135 mmol/L (ref 135–145)

## 2017-07-18 MED ORDER — OXYCODONE-ACETAMINOPHEN 5-325 MG PO TABS: 1.0000 | ORAL_TABLET | ORAL | 0 refills | Status: DC | PRN

## 2017-07-18 MED ORDER — MORPHINE SULFATE (PF) 4 MG/ML IV SOLN
6.0000 mg | Freq: Once | INTRAVENOUS | Status: AC
  Administered 2017-07-18: 6 mg via INTRAVENOUS

## 2017-07-18 MED ORDER — MORPHINE SULFATE (PF) 2 MG/ML IV SOLN
INTRAVENOUS | Status: AC
  Filled 2017-07-18: qty 1

## 2017-07-18 MED ORDER — MORPHINE SULFATE (PF) 4 MG/ML IV SOLN
INTRAVENOUS | Status: AC
  Filled 2017-07-18: qty 1

## 2017-07-18 MED ORDER — IBUPROFEN 800 MG PO TABS
800.0000 mg | ORAL_TABLET | Freq: Once | ORAL | Status: AC
  Administered 2017-07-18: 800 mg via ORAL
  Filled 2017-07-18: qty 1

## 2017-07-18 MED ORDER — HYDROCODONE-ACETAMINOPHEN 5-325 MG PO TABS
1.0000 | ORAL_TABLET | Freq: Once | ORAL | Status: AC
  Administered 2017-07-18: 1 via ORAL
  Filled 2017-07-18: qty 1

## 2017-07-18 MED ORDER — ONDANSETRON HCL 4 MG/2ML IJ SOLN
4.0000 mg | Freq: Once | INTRAMUSCULAR | Status: AC
  Administered 2017-07-18: 4 mg via INTRAVENOUS

## 2017-07-18 MED ORDER — IOPAMIDOL (ISOVUE-370) INJECTION 76%
125.0000 mL | Freq: Once | INTRAVENOUS | Status: AC | PRN
  Administered 2017-07-18: 125 mL via INTRAVENOUS
  Filled 2017-07-18: qty 125

## 2017-07-18 MED ORDER — ONDANSETRON HCL 4 MG/2ML IJ SOLN
INTRAMUSCULAR | Status: AC
  Filled 2017-07-18: qty 2

## 2017-07-18 NOTE — ED Notes (Signed)
FN: pt sent over by PCP for further eval of possible blood clot in leg.

## 2017-07-18 NOTE — ED Triage Notes (Addendum)
Pt awoke at 3AM with throbbing left knee pain. Pt denies recent injury. States pain begins at front of knee cap and radiates down into shin and back of knee. No redness or significant swelling. Pain is sharp and radiating down leg.  Sent for evaluation of possible blood clot.

## 2017-07-18 NOTE — ED Notes (Signed)
Pt had 1 hydrocodone PTA.

## 2017-07-18 NOTE — Discharge Instructions (Signed)
Fortunately today your ultrasound, your CT scan, and your x-ray were reassuring.  Please take your pain medication as needed for severe symptoms and make an appointment to follow-up with the orthopedic surgeon this coming week for reevaluation.  Return to the emergency department sooner for any concerns.  It was a pleasure to take care of you today, and thank you for coming to our emergency department.  If you have any questions or concerns before leaving please ask the nurse to grab me and I'm more than happy to go through your aftercare instructions again.  If you were prescribed any opioid pain medication today such as Norco, Vicodin, Percocet, morphine, hydrocodone, or oxycodone please make sure you do not drive when you are taking this medication as it can alter your ability to drive safely.  If you have any concerns once you are home that you are not improving or are in fact getting worse before you can make it to your follow-up appointment, please do not hesitate to call 911 and come back for further evaluation.  Merrily Brittle, MD  Results for orders placed or performed during the hospital encounter of 07/18/17  Basic metabolic panel  Result Value Ref Range   Sodium 135 135 - 145 mmol/L   Potassium 4.1 3.5 - 5.1 mmol/L   Chloride 102 101 - 111 mmol/L   CO2 24 22 - 32 mmol/L   Glucose, Bld 147 (H) 65 - 99 mg/dL   BUN 16 6 - 20 mg/dL   Creatinine, Ser 1.61 (H) 0.44 - 1.00 mg/dL   Calcium 8.8 (L) 8.9 - 10.3 mg/dL   GFR calc non Af Amer >60 >60 mL/min   GFR calc Af Amer >60 >60 mL/min   Anion gap 9 5 - 15  CBC with Differential  Result Value Ref Range   WBC 5.1 3.6 - 11.0 K/uL   RBC 4.63 3.80 - 5.20 MIL/uL   Hemoglobin 13.6 12.0 - 16.0 g/dL   HCT 09.6 04.5 - 40.9 %   MCV 87.2 80.0 - 100.0 fL   MCH 29.4 26.0 - 34.0 pg   MCHC 33.7 32.0 - 36.0 g/dL   RDW 81.1 91.4 - 78.2 %   Platelets 174 150 - 440 K/uL   Neutrophils Relative % 68 %   Neutro Abs 3.5 1.4 - 6.5 K/uL   Lymphocytes  Relative 24 %   Lymphs Abs 1.2 1.0 - 3.6 K/uL   Monocytes Relative 7 %   Monocytes Absolute 0.3 0.2 - 0.9 K/uL   Eosinophils Relative 1 %   Eosinophils Absolute 0.1 0 - 0.7 K/uL   Basophils Relative 0 %   Basophils Absolute 0.0 0 - 0.1 K/uL   Ct Angio Aortobifemoral W And/or Wo Contrast  Result Date: 07/18/2017 CLINICAL DATA:  Left knee pain. EXAM: CT ANGIOGRAPHY OF ABDOMINAL AORTA WITH ILIOFEMORAL RUNOFF TECHNIQUE: Multidetector CT imaging of the abdomen, pelvis and lower extremities was performed using the standard protocol during bolus administration of intravenous contrast. Multiplanar CT image reconstructions and MIPs were obtained to evaluate the vascular anatomy. CONTRAST:  ISOVUE-370 IOPAMIDOL (ISOVUE-370) INJECTION 76% COMPARISON:  12/08/2015 FINDINGS: VASCULAR Aorta: Normal caliber aorta without aneurysm, dissection, vasculitis or significant stenosis. Celiac: Patent without evidence of aneurysm, dissection, vasculitis or significant stenosis. SMA: Patent without evidence of aneurysm, dissection, vasculitis or significant stenosis. Renals: Both renal arteries are patent without evidence of aneurysm, dissection, vasculitis, fibromuscular dysplasia or significant stenosis. IMA: Patent without evidence of aneurysm, dissection, vasculitis or significant stenosis. RIGHT  Lower Extremity Inflow: Common, internal and external iliac arteries are patent without evidence of aneurysm, dissection, vasculitis or significant stenosis. Outflow: Common, superficial and profunda femoral arteries and the popliteal artery are patent without evidence of aneurysm, dissection, vasculitis or significant stenosis. Runoff: Patent three vessel runoff to the ankle. LEFT Lower Extremity Inflow: Common, internal and external iliac arteries are patent without evidence of aneurysm, dissection, vasculitis or significant stenosis. Outflow: Common, superficial and profunda femoral arteries and the popliteal artery are patent  without evidence of aneurysm, dissection, vasculitis or significant stenosis. Runoff: Patent three vessel runoff to the ankle. Veins: No obvious venous abnormality within the limitations of this arterial phase study. Left pelvic phlebolith. Review of the MIP images confirms the above findings. NON-VASCULAR Lower chest: No acute abnormality. Hepatobiliary: 1.4 cm fluid attenuation lesion in hepatic segment 3. A similar 0.7 cm low-attenuation lesion is noted in segment 6. No intrahepatic biliary ductal dilatation. Cholecystectomy clips. Pancreas: Unremarkable. No pancreatic ductal dilatation or surrounding inflammatory changes. Spleen: Normal in size without focal abnormality. Adrenals/Urinary Tract: Adrenal glands are unremarkable. Kidneys are normal, without renal calculi, focal lesion, or hydronephrosis. Bladder is unremarkable. Stomach/Bowel: Stomach is nondistended. Small bowel decompressed. Normal appendix. Colon is decompressed , unremarkable. Lymphatic: No abdominal or pelvic adenopathy. Reproductive: Status post hysterectomy. No adnexal masses. Other: No ascites.  No free air. Musculoskeletal: No acute or significant osseous findings. IMPRESSION: VASCULAR 1. No evidence of arterial occlusion or other significant vascular abnormality. NON-VASCULAR 1. Small low-attenuation liver lesions, statistically most likely benign cysts in the absence of a history of primary carcinoma. Electronically Signed   By: Corlis Leak M.D.   On: 07/18/2017 16:51   US Venous Img Lower Unilateral Left  Result Date: 07/18/2017 CLINICAL DATA:  Pain behind the left knee since this morning. Evaluate for DVT. EXAM: LEFT LOWER EXTREMITY VENOUS DOPPLER ULTRASOUND TECHNIQUE: Gray-scale sonography with graded compression, as well as color Doppler and duplex ultrasound were performed to evaluate the lower extremity deep venous systems from the level of the common femoral vein and including the common femoral, femoral, profunda femoral,  popliteal and calf veins including the posterior tibial, peroneal and gastrocnemius veins when visible. The superficial great saphenous vein was also interrogated. Spectral Doppler was utilized to evaluate flow at rest and with distal augmentation maneuvers in the common femoral, femoral and popliteal veins. COMPARISON:  None. FINDINGS: Contralateral Common Femoral Vein: Respiratory phasicity is normal and symmetric with the symptomatic side. No evidence of thrombus. Normal compressibility. Common Femoral Vein: No evidence of thrombus. Normal compressibility, respiratory phasicity and response to augmentation. Saphenofemoral Junction: No evidence of thrombus. Normal compressibility and flow on color Doppler imaging. Profunda Femoral Vein: No evidence of thrombus. Normal compressibility and flow on color Doppler imaging. Femoral Vein: No evidence of thrombus. Normal compressibility, respiratory phasicity and response to augmentation. Popliteal Vein: No evidence of thrombus. Normal compressibility, respiratory phasicity and response to augmentation. Calf Veins: No evidence of thrombus. Normal compressibility and flow on color Doppler imaging. Superficial Great Saphenous Vein: No evidence of thrombus. Normal compressibility. Venous Reflux:  None. Other Findings:  None. IMPRESSION: No evidence of DVT within the left lower extremity. Electronically Signed   By: Simonne Come M.D.   On: 07/18/2017 14:08   Dg Knee Complete 4 Views Left  Result Date: 07/18/2017 CLINICAL DATA:  Left knee pain.  No known trauma. EXAM: LEFT KNEE - COMPLETE 4+ VIEW COMPARISON:  None. FINDINGS: No evidence of fracture, dislocation, or joint effusion. No evidence of arthropathy  or other focal bone abnormality. Soft tissues are unremarkable. IMPRESSION: Negative. Electronically Signed   By: Francene BoyersJames  Maxwell M.D.   On: 07/18/2017 15:14

## 2017-07-18 NOTE — ED Provider Notes (Signed)
[facility] Emergency Department Provider Note  ____________________________________________   First MD Initiated Contact with Patient 07/18/17 1406     (approximate)  I have reviewed the triage vital signs and the nursing notes.   HISTORY  Chief Complaint Knee Pain   HPI [name] is a 47 y.o. female who self presents the emergency department with atraumatic severe left knee pain that awoke her from sleep at 3 AM.  The pain is severe in the back of her knee radiating up towards her shin.  She denies trauma.  She denies twisting.  She denies fevers or chills.  She denies history of DVT or pulmonary embolism.  The pain is not particularly worse when ambulating.  It is non-positional.  She is concerned because her father several years ago had an ultrasound of his leg which was determined to be normal and shortly thereafter had an aneurysm of his popliteal artery requiring emergent surgery.  She took 1 hydrocodone prior to coming to the hospital which helped minimally.  Past Medical History:  Diagnosis Date  . Anxiety   . Blood dyscrasia    hx of clotting disorder  . Clotting disorder (HCC)   . Depression   . Diabetes mellitus without complication (HCC)   . Ovarian cyst   . S/P laparoscopic cholecystectomy     Patient Active Problem List   Diagnosis Date Noted  . Diarrhea 08/12/2011  . Nausea and vomiting 08/04/2011  . Abdominal pain of unknown etiology 08/04/2011  . Hypokalemia 08/04/2011  . Dehydration 08/04/2011  . Ovarian cystic mass 08/04/2011  . Hyponatremia 08/04/2011  . Back pain 08/04/2011  . Scoliosis 08/04/2011    Past Surgical History:  Procedure Laterality Date  . ABDOMINAL HYSTERECTOMY    . CEASAREAN    . CHOLECYSTECTOMY      Prior to Admission medications   Medication Sig Start Date End Date Taking? Authorizing Provider  albuterol (PROVENTIL HFA;VENTOLIN HFA) 108 (90 BASE) MCG/ACT inhaler Inhale 2 puffs into the lungs every  6 (six) hours as needed for wheezing or shortness of breath. 02/26/15   Phineas Semen, MD  albuterol (PROVENTIL HFA;VENTOLIN HFA) 108 (90 BASE) MCG/ACT inhaler Inhale 2 puffs into the lungs every 6 (six) hours as needed for wheezing or shortness of breath. 02/26/15   Phineas Semen, MD  ALPRAZolam Prudy Feeler) 0.25 MG tablet Take 0.25 mg by mouth 2 (two) times daily as needed. For anxiety    [provider]  citalopram (CELEXA) 20 MG tablet Take 1 tablet (20 mg total) by mouth daily. 05/13/12   Pisciotta, Joni Reining, PA-C  diphenhydrAMINE (SOMINEX) 25 MG tablet Take 25 mg by mouth daily as needed. For sleep    [provider]  HYDROcodone-acetaminophen (NORCO/VICODIN) 5-325 MG per tablet Take 1 tablet by mouth 2 (two) times daily as needed. For lower quadrant pain    [provider]  ibuprofen (ADVIL,MOTRIN) 800 MG tablet Take 1 tablet (800 mg total) by mouth every 8 (eight) hours as needed for moderate pain. 12/08/15   Irean Hong, MD  Melatonin 5 MG TABS Take 1 tablet by mouth.    [provider]  metoCLOPramide (REGLAN) 5 MG tablet Take 1 tablet (5 mg total) by mouth every 8 (eight) hours as needed. 08/14/11 08/24/11  Dellinger, Tora Kindred, PA-C  ondansetron (ZOFRAN) 8 MG tablet Take 4 mg by mouth every 8 (eight) hours as needed. For nausea    [provider]  oxycodone (OXY-IR) 5 MG capsule Take  5 mg by mouth every 6 (six) hours as needed. For lower quadrant pain    [provider]  oxyCODONE-acetaminophen (PERCOCET/ROXICET) 5-325 MG tablet Take 1 tablet by mouth every 4 (four) hours as needed for severe pain. 07/18/17   Merrily Brittleifenbark, Franck Vinal, MD  oxyCODONE-acetaminophen (ROXICET) 5-325 MG tablet Take 1 tablet by mouth every 4 (four) hours as needed for severe pain. 12/08/15   Irean HongSung, Jade J, MD  QUEtiapine (SEROQUEL) 100 MG tablet Take 100 mg by mouth at bedtime.    [provider]  traMADol (ULTRAM) 50 MG tablet Take 1 tablet (50 mg total) by mouth every  6 (six) hours as needed. 08/17/16 08/17/17  Enid DerryWagner, Ashley, PA-C    Allergies Promethazine hcl  Family History  Problem Relation Age of Onset  . Breast cancer Mother 11051    Social History Social History   Tobacco Use  . Smoking status: Former Smoker    Packs/day: 0.00    Years: 20.00    Pack years: 0.00    Types: Cigarettes  . Smokeless tobacco: Never Used  Substance Use Topics  . Alcohol use: No  . Drug use: No    Review of Systems Constitutional: No fever/chills ENT: No sore throat. Cardiovascular: Denies chest pain. Respiratory: Denies shortness of breath. Gastrointestinal: No abdominal pain.  No nausea, no vomiting.  No diarrhea.  No constipation. Musculoskeletal: Negative for back pain. Neurological: Negative for headaches   ____________________________________________   PHYSICAL EXAM:  VITAL SIGNS: ED Triage Vitals  Enc Vitals Group     BP 07/18/17 1202 (!) 101/56     Pulse Rate 07/18/17 1202 85     Resp 07/18/17 1202 18     Temp 07/18/17 1202 98.3 F (36.8 C)     Temp Source 07/18/17 1202 Oral     SpO2 07/18/17 1202 96 %     Weight 07/18/17 1202 185 lb (83.9 kg)     Height 07/18/17 1202 5\' 7"  (1.702 m)     Head Circumference --      Peak Flow --      Pain Score 07/18/17 1225 10     Pain Loc --      Pain Edu? --      Excl. in GC? --     Constitutional: Alert and oriented x4 tearful and uncomfortable appearing nontoxic no diaphoresis speaks in full clear sentences Head: Atraumatic. Nose: No congestion/rhinnorhea. Mouth/Throat: No trismus Neck: No stridor.   Cardiovascular: Regular rate and rhythm Respiratory: Normal respiratory effort.  No retractions. MSK: Legs are equal in size no erythema warmth or focal tenderness.  2+ dorsalis pedis pulses bilaterally.  Compartments are soft.  Able to range her knee although with some discomfort.  No effusion Neurologic:  Normal speech and language. No gross focal neurologic deficits are appreciated.  Skin:   Skin is warm, dry and intact. No rash noted.    ____________________________________________  LABS (all labs ordered are listed, but only abnormal results are displayed)  Labs Reviewed  BASIC METABOLIC PANEL - Abnormal; Notable for the following components:      Result Value   Glucose, Bld 147 (*)    Creatinine, Ser 1.04 (*)    Calcium 8.8 (*)    All other components within normal limits  CBC WITH DIFFERENTIAL/PLATELET    Lab work reviewed by me with no acute disease __________________________________________  EKG   ____________________________________________  RADIOLOGY  Left lower extremity ultrasound reviewed by me with no acute disease CT angiogram of the  left lower extremity reviewed by me with no acute disease ____________________________________________   DIFFERENTIAL includes but not limited to  DVT, Baker's cyst, septic joint, gout, pseudogout, aneurysm, dissection, claudication   PROCEDURES  Procedure(s) performed: no  Procedures  Critical Care performed: no  Observation: no ____________________________________________   INITIAL IMPRESSION / ASSESSMENT AND PLAN / ED COURSE  Pertinent labs & imaging results that were available during my care of the patient were reviewed by me and considered in my medical decision making (see chart for details).  The patient arrives with a significant amount of discomfort.  Ultrasound of the left lower extremity is unremarkable and shows no Baker's cyst and no blood clot.  She does not feel completely improved after pain medication.  I had a lengthy discussion with the patient and family at bedside and they were concerned because this was exactly how her father presented when he had a popliteal artery aneurysm.  I do think as she has a family history of this it is reasonable to proceed on to CT angiogram.  Fortunately the patient's CT angiogram is negative for acute vascular or bony injury.  The patient's pain is  somewhat improved at this point.  I had a lengthy discussion regarding the diagnostic uncertainty and the need for close primary care and orthopedic follow-up.  The patient verbalizes understanding and agreement with the plan.  Strict return precautions have been given.      ____________________________________________   FINAL CLINICAL IMPRESSION(S) / ED DIAGNOSES  Final diagnoses:  Acute pain of left knee      NEW MEDICATIONS STARTED DURING THIS VISIT:  Discharge Medication List as of 07/18/2017  5:19 PM    START taking these medications   Details  !! oxyCODONE-acetaminophen (PERCOCET/ROXICET) 5-325 MG tablet Take 1 tablet by mouth every 4 (four) hours as needed for severe pain., Starting Fri 07/18/2017, Print     !! - Potential duplicate medications found. Please discuss with provider.       Note:  This document was prepared using Dragon voice recognition software and may include unintentional dictation errors.      Merrily Brittle, MD 07/20/17 [phone]

## 2017-07-18 NOTE — ED Notes (Signed)
Pt discharged to home.  Family member driving.  Discharge instructions reviewed.  Verbalized understanding.  No questions or concerns at this time.  Teach back verified.  Pt in NAD.  No items left in ED.   

## 2018-08-06 ENCOUNTER — Other Ambulatory Visit: Payer: Self-pay | Admitting: Family Medicine

## 2018-08-06 DIAGNOSIS — N63 Unspecified lump in unspecified breast: Secondary | ICD-10-CM

## 2018-08-06 DIAGNOSIS — R928 Other abnormal and inconclusive findings on diagnostic imaging of breast: Secondary | ICD-10-CM

## 2018-08-29 ENCOUNTER — Emergency Department: Payer: BLUE CROSS/BLUE SHIELD

## 2018-08-29 ENCOUNTER — Other Ambulatory Visit: Payer: Self-pay

## 2018-08-29 ENCOUNTER — Encounter: Payer: Self-pay | Admitting: Emergency Medicine

## 2018-08-29 ENCOUNTER — Emergency Department
Admission: EM | Admit: 2018-08-29 | Discharge: 2018-08-29 | Disposition: A | Discharge status: Home / Self Care | Payer: BLUE CROSS/BLUE SHIELD | Attending: Emergency Medicine | Admitting: Emergency Medicine

## 2018-08-29 DIAGNOSIS — E119 Type 2 diabetes mellitus without complications: Secondary | ICD-10-CM | POA: Insufficient documentation

## 2018-08-29 DIAGNOSIS — J40 Bronchitis, not specified as acute or chronic: Secondary | ICD-10-CM

## 2018-08-29 DIAGNOSIS — Z79899 Other long term (current) drug therapy: Secondary | ICD-10-CM | POA: Insufficient documentation

## 2018-08-29 DIAGNOSIS — Z87891 Personal history of nicotine dependence: Secondary | ICD-10-CM | POA: Insufficient documentation

## 2018-08-29 DIAGNOSIS — Z20828 Contact with and (suspected) exposure to other viral communicable diseases: Secondary | ICD-10-CM | POA: Insufficient documentation

## 2018-08-29 LAB — COMPREHENSIVE METABOLIC PANEL
ALT: 25 U/L (ref 0–44)
AST: 26 U/L (ref 15–41)
Albumin: 3.9 g/dL (ref 3.5–5.0)
Alkaline Phosphatase: 67 U/L (ref 38–126)
Anion gap: 10 (ref 5–15)
BUN: 12 mg/dL (ref 6–20)
CO2: 24 mmol/L (ref 22–32)
Calcium: 9.2 mg/dL (ref 8.9–10.3)
Chloride: 102 mmol/L (ref 98–111)
Creatinine, Ser: 1 mg/dL (ref 0.44–1.00)
GFR calc Af Amer: >60 mL/min (ref >60)
GFR calc non Af Amer: >60 mL/min (ref >60)
Glucose, Bld: 151 mg/dL — ABNORMAL HIGH (ref 70–99)
Potassium: 3.3 mmol/L — ABNORMAL LOW (ref 3.5–5.1)
Sodium: 136 mmol/L (ref 135–145)
Total Bilirubin: 0.9 mg/dL (ref 0.3–1.2)
Total Protein: 7 g/dL (ref 6.5–8.1)

## 2018-08-29 LAB — CBC WITH DIFFERENTIAL/PLATELET
Abs Immature Granulocytes: 0.02 10*3/uL (ref 0.00–0.07)
Basophils Absolute: 0 10*3/uL (ref 0.0–0.1)
Basophils Relative: 0 %
Eosinophils Absolute: 0 10*3/uL (ref 0.0–0.5)
Eosinophils Relative: 1 %
HCT: 35.4 % — ABNORMAL LOW (ref 36.0–46.0)
Hemoglobin: 12.1 g/dL (ref 12.0–15.0)
Immature Granulocytes: 0 %
Lymphocytes Relative: 22 %
Lymphs Abs: 1 10*3/uL (ref 0.7–4.0)
MCH: 30.3 pg (ref 26.0–34.0)
MCHC: 34.2 g/dL (ref 30.0–36.0)
MCV: 88.7 fL (ref 80.0–100.0)
Monocytes Absolute: 0.5 10*3/uL (ref 0.1–1.0)
Monocytes Relative: 11 %
Neutro Abs: 3 10*3/uL (ref 1.7–7.7)
Neutrophils Relative %: 66 %
Platelets: 149 10*3/uL — ABNORMAL LOW (ref 150–400)
RBC: 3.99 MIL/uL (ref 3.87–5.11)
RDW: 13.2 % (ref 11.5–15.5)
WBC: 4.6 10*3/uL (ref 4.0–10.5)
nRBC: 0 % (ref 0.0–0.2)

## 2018-08-29 LAB — SARS CORONAVIRUS 2 BY RT PCR (HOSPITAL ORDER, PERFORMED IN ~~LOC~~ HOSPITAL LAB): SARS Coronavirus 2: NEGATIVE

## 2018-08-29 MED ORDER — AZITHROMYCIN 250 MG PO TABS: ORAL_TABLET | ORAL | 0 refills | Status: AC

## 2018-08-29 MED ORDER — BENZONATATE 100 MG PO CAPS: 100.0000 mg | ORAL_CAPSULE | Freq: Three times a day (TID) | ORAL | 0 refills | Status: AC | PRN

## 2018-08-29 MED ORDER — ALBUTEROL SULFATE HFA 108 (90 BASE) MCG/ACT IN AERS: 2.0000 | INHALATION_SPRAY | Freq: Four times a day (QID) | RESPIRATORY_TRACT | 2 refills | Status: AC | PRN

## 2018-08-29 NOTE — Discharge Instructions (Addendum)
Take the Zithromax as directed and use the inhaler 2 puffs 4 times a day as needed for the cough.  If just not working I will give you some Tessalon Perles to remember not to suck on them just swallow them.  Please return for higher fever worsening cough or shortness of breath.  Have your regular doctor check on you next 2 to 3 days.  You should quarantine yourself until you are at least 3 days fever free without antipyretics and you have had an improvement in your cough or respiratory symptoms and at least 7 days after the onset of the symptoms.  Of course this does not apply for test is negative.

## 2018-08-29 NOTE — ED Provider Notes (Signed)
[facility] Emergency Department Provider Note   ____________________________________________   First MD Initiated Contact with Patient 08/29/18 [phone]     (approximate)  I have reviewed the triage vital signs and the nursing notes.   HISTORY  Chief Complaint Cough and Fever   HPI [name] is a 48 y.o. female patient with 2 days of coughing productive of small amounts of yellowish phlegm.  She had a fever of 101 last night.  She feels little bit short of breath.  Her chest is achy from the coughing.  No other complaints.  Patient's daughter had gone to [place] spring break came back has been healthy but could have been exposed to coronavirus down there.         Past Medical History:  Diagnosis Date  . Anxiety   . Blood dyscrasia    hx of clotting disorder  . Clotting disorder (HCC)   . Depression   . Diabetes mellitus without complication (HCC)   . Ovarian cyst   . S/P laparoscopic cholecystectomy     Patient Active Problem List   Diagnosis Date Noted  . Diarrhea 08/12/2011  . Nausea and vomiting 08/04/2011  . Abdominal pain of unknown etiology 08/04/2011  . Hypokalemia 08/04/2011  . Dehydration 08/04/2011  . Ovarian cystic mass 08/04/2011  . Hyponatremia 08/04/2011  . Back pain 08/04/2011  . Scoliosis 08/04/2011    Past Surgical History:  Procedure Laterality Date  . ABDOMINAL HYSTERECTOMY    . CEASAREAN    . CHOLECYSTECTOMY      Prior to Admission medications   Medication Sig Start Date End Date Taking? Authorizing Provider  albuterol (PROVENTIL HFA;VENTOLIN HFA) 108 (90 BASE) MCG/ACT inhaler Inhale 2 puffs into the lungs every 6 (six) hours as needed for wheezing or shortness of breath. 02/26/15   Phineas SemenGoodman, Graydon, MD  ALPRAZolam Prudy Feeler(XANAX) 0.25 MG tablet Take 0.25 mg by mouth 2 (two) times daily as needed. For anxiety    [provider]  atorvastatin (LIPITOR) 40 MG tablet Take 40 mg by mouth daily. 08/06/18    [provider]  Cholecalciferol (VITAMIN D3) 1.25 MG (50000 UT) CAPS Take 50,000 Units by mouth once a week. 08/06/18   [provider]  citalopram (CELEXA) 40 MG tablet Take 40 mg by mouth daily. 08/15/18   [provider]  cyclobenzaprine (FLEXERIL) 10 MG tablet Take 10 mg by mouth 3 (three) times daily.    [provider]  diphenhydrAMINE (SOMINEX) 25 MG tablet Take 25 mg by mouth daily as needed. For sleep    [provider]  HYDROcodone-acetaminophen (NORCO/VICODIN) 5-325 MG per tablet Take 1 tablet by mouth 2 (two) times daily as needed. For lower quadrant pain    [provider]  ibuprofen (ADVIL,MOTRIN) 800 MG tablet Take 1 tablet (800 mg total) by mouth every 8 (eight) hours as needed for moderate pain. Patient not taking: Reported on 08/29/2018 12/08/15   Irean HongSung, Jade J, MD  Melatonin 5 MG TABS Take 1 tablet by mouth.    [provider]  meloxicam (MOBIC) 15 MG tablet Take 15 mg by mouth daily.    [provider]  metoCLOPramide (REGLAN) 5 MG tablet Take 1 tablet (5 mg total) by mouth every 8 (eight) hours as needed. 08/14/11 08/24/11  Dellinger, Tora KindredMarianne L, PA-C  ondansetron (ZOFRAN) 8 MG tablet Take 4 mg by mouth every 8 (eight) hours as needed. For nausea    [provider]  oxycodone (OXY-IR) 5  MG capsule Take 5 mg by mouth every 6 (six) hours as needed. For lower quadrant pain    [provider]  oxyCODONE-acetaminophen (PERCOCET/ROXICET) 5-325 MG tablet Take 1 tablet by mouth every 4 (four) hours as needed for severe pain. Patient not taking: Reported on 08/29/2018 07/18/17   Merrily Brittle, MD  oxyCODONE-acetaminophen (ROXICET) 5-325 MG tablet Take 1 tablet by mouth every 4 (four) hours as needed for severe pain. Patient not taking: Reported on 08/29/2018 12/08/15   Irean Hong, MD  QUEtiapine (SEROQUEL) 100 MG tablet Take 100 mg by mouth at bedtime.    [provider]    Allergies  Promethazine hcl  Family History  Problem Relation Age of Onset  . Breast cancer Mother 76    Social History Social History   Tobacco Use  . Smoking status: Former Smoker    Packs/day: 0.00    Years: 20.00    Pack years: 0.00    Types: Cigarettes  . Smokeless tobacco: Never Used  Substance Use Topics  . Alcohol use: No  . Drug use: No    Review of Systems  Constitutional:  fever Eyes: No visual changes. ENT: No sore throat. Cardiovascular: Achy chest pain with coughing. Respiratory: Slight shortness of breath. Gastrointestinal: No abdominal pain.  No nausea, no vomiting.  No diarrhea.  No constipation. Genitourinary: Negative for dysuria. Musculoskeletal: Negative for back pain. Skin: Negative for rash. Neurological: Negative for headaches, focal weakness  ____________________________________________   PHYSICAL EXAM:  VITAL SIGNS: ED Triage Vitals  Enc Vitals Group     BP 08/29/18 0745 123/85     Pulse Rate 08/29/18 0745 90     Resp 08/29/18 0745 16     Temp 08/29/18 0745 98.1 F (36.7 C)     Temp Source 08/29/18 0745 Oral     SpO2 08/29/18 0745 93 %     Weight 08/29/18 0738 190 lb (86.2 kg)     Height 08/29/18 0738  (1.727 m)     Head Circumference --      Peak Flow --      Pain Score 08/29/18 0738 7     Pain Loc --      Pain Edu? --      Excl. in GC? --     Constitutional: Alert and oriented. Well appearing and in no acute distress. Eyes: Conjunctivae are normal.  Head: Atraumatic. Nose: No congestion/rhinnorhea. Mouth/Throat: Mucous membranes are moist.  Oropharynx non-erythematous. Neck: No stridor.  Cardiovascular: Normal rate, regular rhythm. Grossly normal heart sounds.  Good peripheral circulation. Respiratory: Normal respiratory effort.  No retractions. Lungs CTAB. Gastrointestinal: Soft and nontender. No distention. No abdominal bruits. No CVA tenderness. Musculoskeletal: No lower extremity tenderness nor edema.   Neurologic:   Normal speech and language. No gross focal neurologic deficits are appreciated. No gait instability. Skin:  Skin is warm, dry and intact. No rash noted.   ____________________________________________   LABS (all labs ordered are listed, but only abnormal results are displayed)  Labs Reviewed  COMPREHENSIVE METABOLIC PANEL - Abnormal; Notable for the following components:      Result Value   Potassium 3.3 (*)    Glucose, Bld 151 (*)    All other components within normal limits  CBC WITH DIFFERENTIAL/PLATELET - Abnormal; Notable for the following components:   HCT 35.4 (*)    Platelets 149 (*)    All other components within normal limits  SARS CORONAVIRUS 2 (HOSPITAL ORDER, PERFORMED IN [facility]  LAB)   ____________________________________________  EKG   ____________________________________________  RADIOLOGY  ED MD interpretation:   Official radiology report(s): Dg Chest Portable 1 View  Result Date: 08/29/2018 CLINICAL DATA:  Fever and cough. EXAM: PORTABLE CHEST 1 VIEW COMPARISON:  February 26, 2015 FINDINGS: The heart size and mediastinal contours are within normal limits. Both lungs are clear. The visualized skeletal structures are unremarkable. IMPRESSION: No active disease. Electronically Signed   By: Gerome Sam III M.D   On: 08/29/2018 08:32    ____________________________________________   PROCEDURES  Procedure(s) performed (including Critical Care):  Procedures   ____________________________________________   INITIAL IMPRESSION / ASSESSMENT AND PLAN / ED COURSE  [name] was evaluated in Emergency Department on 08/29/2018 for the symptoms described in the history of present illness. She was evaluated in the context of the global COVID-19 pandemic, which necessitated consideration that the patient might be at risk for infection with the SARS-CoV-2 virus that causes COVID-19. Institutional protocols and algorithms that pertain to the  evaluation of patients at risk for COVID-19 are in a state of rapid change based on information released by regulatory bodies including the CDC and federal and state organizations. These policies and algorithms were followed during the patient's care in the ED. Patient is doing well her chest x-ray is within normal limits, white count is good she is afebrile currently.  I will treat her for bronchitis.  She will call for further results.  I have given her discharge instructions.            ____________________________________________   FINAL CLINICAL IMPRESSION(S) / ED DIAGNOSES  Final diagnoses:  Bronchitis     ED Discharge Orders    None       Note:  This document was prepared using Dragon voice recognition software and may include unintentional dictation errors.    Arnaldo Natal, MD 08/29/18 [phone]

## 2018-08-29 NOTE — ED Triage Notes (Signed)
Here for fever and cough. Tmax 101.  Cough for 2 days and fever since last night.  Pt unlabored.  Some SHOB. Mask applied.

## 2018-09-17 ENCOUNTER — Other Ambulatory Visit: Payer: BLUE CROSS/BLUE SHIELD

## 2018-09-21 ENCOUNTER — Ambulatory Visit
Admission: RE | Admit: 2018-09-21 | Discharge: 2018-09-21 | Disposition: A | Discharge status: Home / Self Care | Payer: BLUE CROSS/BLUE SHIELD | Source: Ambulatory Visit | Attending: Family Medicine | Admitting: Family Medicine

## 2018-09-21 ENCOUNTER — Other Ambulatory Visit: Payer: Self-pay

## 2018-09-21 DIAGNOSIS — N63 Unspecified lump in unspecified breast: Secondary | ICD-10-CM | POA: Insufficient documentation

## 2018-09-21 DIAGNOSIS — R928 Other abnormal and inconclusive findings on diagnostic imaging of breast: Secondary | ICD-10-CM

## 2019-11-16 ENCOUNTER — Other Ambulatory Visit: Payer: Self-pay | Admitting: Family Medicine

## 2019-11-16 DIAGNOSIS — Z1231 Encounter for screening mammogram for malignant neoplasm of breast: Secondary | ICD-10-CM

## 2019-11-29 ENCOUNTER — Ambulatory Visit
Admission: RE | Admit: 2019-11-29 | Discharge: 2019-11-29 | Disposition: A | Discharge status: Home / Self Care | Payer: 59 | Source: Ambulatory Visit | Attending: Family Medicine | Admitting: Family Medicine

## 2019-11-29 DIAGNOSIS — Z1231 Encounter for screening mammogram for malignant neoplasm of breast: Secondary | ICD-10-CM | POA: Insufficient documentation

## 2020-08-23 ENCOUNTER — Encounter (HOSPITAL_COMMUNITY): Payer: Self-pay

## 2020-08-23 ENCOUNTER — Emergency Department (HOSPITAL_COMMUNITY)
Admission: EM | Admit: 2020-08-23 | Discharge: 2020-08-23 | Disposition: A | Discharge status: Home / Self Care | Payer: 59 | Attending: Emergency Medicine | Admitting: Emergency Medicine

## 2020-08-23 ENCOUNTER — Emergency Department (HOSPITAL_COMMUNITY): Payer: 59

## 2020-08-23 ENCOUNTER — Other Ambulatory Visit: Payer: Self-pay

## 2020-08-23 DIAGNOSIS — R197 Diarrhea, unspecified: Secondary | ICD-10-CM | POA: Insufficient documentation

## 2020-08-23 DIAGNOSIS — R112 Nausea with vomiting, unspecified: Secondary | ICD-10-CM | POA: Diagnosis not present

## 2020-08-23 DIAGNOSIS — Z87891 Personal history of nicotine dependence: Secondary | ICD-10-CM | POA: Insufficient documentation

## 2020-08-23 DIAGNOSIS — D72819 Decreased white blood cell count, unspecified: Secondary | ICD-10-CM | POA: Insufficient documentation

## 2020-08-23 DIAGNOSIS — R1011 Right upper quadrant pain: Secondary | ICD-10-CM | POA: Diagnosis present

## 2020-08-23 DIAGNOSIS — R059 Cough, unspecified: Secondary | ICD-10-CM | POA: Insufficient documentation

## 2020-08-23 DIAGNOSIS — Z79899 Other long term (current) drug therapy: Secondary | ICD-10-CM | POA: Insufficient documentation

## 2020-08-23 DIAGNOSIS — E119 Type 2 diabetes mellitus without complications: Secondary | ICD-10-CM | POA: Diagnosis not present

## 2020-08-23 DIAGNOSIS — R61 Generalized hyperhidrosis: Secondary | ICD-10-CM | POA: Insufficient documentation

## 2020-08-23 DIAGNOSIS — K529 Noninfective gastroenteritis and colitis, unspecified: Secondary | ICD-10-CM

## 2020-08-23 LAB — COMPREHENSIVE METABOLIC PANEL
ALT: 30 U/L (ref 0–44)
AST: 29 U/L (ref 15–41)
Albumin: 3.5 g/dL (ref 3.5–5.0)
Alkaline Phosphatase: 77 U/L (ref 38–126)
Anion gap: 11 (ref 5–15)
BUN: 7 mg/dL (ref 6–20)
CO2: 23 mmol/L (ref 22–32)
Calcium: 8.9 mg/dL (ref 8.9–10.3)
Chloride: 97 mmol/L — ABNORMAL LOW (ref 98–111)
Creatinine, Ser: 1.05 mg/dL — ABNORMAL HIGH (ref 0.44–1.00)
GFR, Estimated: >60 mL/min (ref >60)
Glucose, Bld: 254 mg/dL — ABNORMAL HIGH (ref 70–99)
Potassium: 3.1 mmol/L — ABNORMAL LOW (ref 3.5–5.1)
Sodium: 131 mmol/L — ABNORMAL LOW (ref 135–145)
Total Bilirubin: 0.6 mg/dL (ref 0.3–1.2)
Total Protein: 6.5 g/dL (ref 6.5–8.1)

## 2020-08-23 LAB — CBC WITH DIFFERENTIAL/PLATELET
Abs Immature Granulocytes: 0.02 10*3/uL (ref 0.00–0.07)
Basophils Absolute: 0 10*3/uL (ref 0.0–0.1)
Basophils Relative: 0 %
Eosinophils Absolute: 0.1 10*3/uL (ref 0.0–0.5)
Eosinophils Relative: 2 %
HCT: 41.1 % (ref 36.0–46.0)
Hemoglobin: 14 g/dL (ref 12.0–15.0)
Immature Granulocytes: 1 %
Lymphocytes Relative: 22 %
Lymphs Abs: 0.7 10*3/uL (ref 0.7–4.0)
MCH: 29.4 pg (ref 26.0–34.0)
MCHC: 34.1 g/dL (ref 30.0–36.0)
MCV: 86.2 fL (ref 80.0–100.0)
Monocytes Absolute: 0.5 10*3/uL (ref 0.1–1.0)
Monocytes Relative: 14 %
Neutro Abs: 2.1 10*3/uL (ref 1.7–7.7)
Neutrophils Relative %: 61 %
Platelets: 166 10*3/uL (ref 150–400)
RBC: 4.77 MIL/uL (ref 3.87–5.11)
RDW: 13.2 % (ref 11.5–15.5)
WBC: 3.4 10*3/uL — ABNORMAL LOW (ref 4.0–10.5)
nRBC: 0 % (ref 0.0–0.2)

## 2020-08-23 LAB — LIPASE, BLOOD: Lipase: 35 U/L (ref 11–51)

## 2020-08-23 LAB — I-STAT BETA HCG BLOOD, ED (MC, WL, AP ONLY): I-stat hCG, quantitative: <5 m[IU]/mL (ref <5)

## 2020-08-23 LAB — C DIFFICILE QUICK SCREEN W PCR REFLEX
C Diff antigen: NEGATIVE
C Diff interpretation: NOT DETECTED
C Diff toxin: NEGATIVE

## 2020-08-23 MED ORDER — ONDANSETRON HCL 4 MG PO TABS: 4.0000 mg | ORAL_TABLET | Freq: Three times a day (TID) | ORAL | 0 refills | Status: AC | PRN

## 2020-08-23 MED ORDER — MORPHINE SULFATE (PF) 4 MG/ML IV SOLN
4.0000 mg | Freq: Once | INTRAVENOUS | Status: AC
  Administered 2020-08-23: 4 mg via INTRAVENOUS
  Filled 2020-08-23: qty 1

## 2020-08-23 MED ORDER — IOHEXOL 300 MG/ML  SOLN
100.0000 mL | Freq: Once | INTRAMUSCULAR | Status: AC | PRN
  Administered 2020-08-23: 100 mL via INTRAVENOUS

## 2020-08-23 MED ORDER — ONDANSETRON HCL 4 MG/2ML IJ SOLN
4.0000 mg | Freq: Once | INTRAMUSCULAR | Status: AC
  Administered 2020-08-23: 4 mg via INTRAVENOUS
  Filled 2020-08-23: qty 2

## 2020-08-23 MED ORDER — DICYCLOMINE HCL 20 MG PO TABS: 20.0000 mg | ORAL_TABLET | Freq: Two times a day (BID) | ORAL | 0 refills | Status: AC

## 2020-08-23 MED ORDER — OXYCODONE-ACETAMINOPHEN 5-325 MG PO TABS: 1.0000 | ORAL_TABLET | Freq: Four times a day (QID) | ORAL | 0 refills | Status: DC | PRN

## 2020-08-23 NOTE — ED Triage Notes (Signed)
Pt sent here from [facility] due to diarrhea & abd pain.Pt reports for the past 3-4 days. Pt reports N&V.

## 2020-08-23 NOTE — Discharge Instructions (Signed)

## 2020-08-23 NOTE — ED Provider Notes (Signed)
[facility] EMERGENCY DEPARTMENT Provider Note   CSN: [id number] Arrival date & time: 08/23/20  1004     History Chief Complaint  Patient presents with  . Abdominal Pain    [name] is a 50 y.o. female sent in from her primary care doctor's office for evaluation of abdominal pain and diarrhea.  She has a past medical history of diabetes, hyperlipidemia, and endometriosis.  She is status post multiple exploratory laparotomy uroscopy is, cholecystectomy and abdominal hysterectomy.  Patient has had 1 week of diarrhea which she describes as brown, watery and very foul-smelling.  She has a previous history of C. difficile colitis and has not had any recent antibiotic use.  3 days ago she began having severe pain in the right upper quadrant of her abdomen which radiates to her back with associated nausea, vomiting, episodes of diaphoresis.  Her pain is made worse when she leans forward or takes deep breaths.  It is also worsened with coughing, laughing, movement or palpation of the abdomen.  She denies any urinary symptoms, fever or chills.  She has no recent foreign travel or ingestion of suspicious foods.  HPI     Past Medical History:  Diagnosis Date  . Anxiety   . Blood dyscrasia    hx of clotting disorder  . Clotting disorder (HCC)   . Depression   . Diabetes mellitus without complication (HCC)   . Ovarian cyst   . S/P laparoscopic cholecystectomy     Patient Active Problem List   Diagnosis Date Noted  . Diarrhea 08/12/2011  . Nausea and vomiting 08/04/2011  . Abdominal pain of unknown etiology 08/04/2011  . Hypokalemia 08/04/2011  . Dehydration 08/04/2011  . Ovarian cystic mass 08/04/2011  . Hyponatremia 08/04/2011  . Back pain 08/04/2011  . Scoliosis 08/04/2011    Past Surgical History:  Procedure Laterality Date  . ABDOMINAL HYSTERECTOMY    . CEASAREAN    . CHOLECYSTECTOMY       OB History   No obstetric history on file.     Family  History  Problem Relation Age of Onset  . Breast cancer Mother 57    Social History   Tobacco Use  . Smoking status: Former Smoker    Packs/day: 0.00    Years: 20.00    Pack years: 0.00    Types: Cigarettes  . Smokeless tobacco: Never Used  Substance Use Topics  . Alcohol use: No  . Drug use: No    Home Medications Prior to Admission medications   Medication Sig Start Date End Date Taking? Authorizing Provider  albuterol (PROVENTIL HFA;VENTOLIN HFA) 108 (90 BASE) MCG/ACT inhaler Inhale 2 puffs into the lungs every 6 (six) hours as needed for wheezing or shortness of breath. 02/26/15   Phineas Semen, MD  albuterol (VENTOLIN HFA) 108 (90 Base) MCG/ACT inhaler Inhale 2 puffs into the lungs every 6 (six) hours as needed for wheezing or shortness of breath. 08/29/18   Arnaldo Natal, MD  ALPRAZolam Prudy Feeler) 0.25 MG tablet Take 0.25 mg by mouth 2 (two) times daily as needed. For anxiety    [provider]  atorvastatin (LIPITOR) 40 MG tablet Take 40 mg by mouth daily. 08/06/18   [provider]  Cholecalciferol (VITAMIN D3) 1.25 MG (50000 UT) CAPS Take 50,000 Units by mouth once a week. 08/06/18   [provider]  citalopram (CELEXA) 40 MG tablet Take 40 mg by mouth daily. 08/15/18   [provider]  cyclobenzaprine (  FLEXERIL) 10 MG tablet Take 10 mg by mouth 3 (three) times daily.    [provider]  diphenhydrAMINE (SOMINEX) 25 MG tablet Take 25 mg by mouth daily as needed. For sleep    [provider]  HYDROcodone-acetaminophen (NORCO/VICODIN) 5-325 MG per tablet Take 1 tablet by mouth 2 (two) times daily as needed. For lower quadrant pain    [provider]  ibuprofen (ADVIL,MOTRIN) 800 MG tablet Take 1 tablet (800 mg total) by mouth every 8 (eight) hours as needed for moderate pain. Patient not taking: Reported on 08/29/2018 12/08/15   Irean Hong, MD  Melatonin 5 MG TABS Take 1 tablet by mouth.    [provider]   meloxicam (MOBIC) 15 MG tablet Take 15 mg by mouth daily.    [provider]  metoCLOPramide (REGLAN) 5 MG tablet Take 1 tablet (5 mg total) by mouth every 8 (eight) hours as needed. 08/14/11 08/24/11  Dellinger, Tora Kindred, PA-C  ondansetron (ZOFRAN) 8 MG tablet Take 4 mg by mouth every 8 (eight) hours as needed. For nausea    [provider]  oxycodone (OXY-IR) 5 MG capsule Take 5 mg by mouth every 6 (six) hours as needed. For lower quadrant pain    [provider]  oxyCODONE-acetaminophen (PERCOCET/ROXICET) 5-325 MG tablet Take 1 tablet by mouth every 4 (four) hours as needed for severe pain. Patient not taking: Reported on 08/29/2018 07/18/17   Merrily Brittle, MD  oxyCODONE-acetaminophen (ROXICET) 5-325 MG tablet Take 1 tablet by mouth every 4 (four) hours as needed for severe pain. Patient not taking: Reported on 08/29/2018 12/08/15   Irean Hong, MD  QUEtiapine (SEROQUEL) 100 MG tablet Take 100 mg by mouth at bedtime.    [provider]    Allergies    Promethazine hcl  Review of Systems   Review of Systems Ten systems reviewed and are negative for acute change, except as noted in the HPI.  Physical Exam Updated Vital Signs BP 132/89   Pulse 87   Temp 99.6 F (37.6 C) (Oral)   Resp 16   SpO2 97%   Physical Exam Vitals and nursing note reviewed.  Constitutional:      General: She is not in acute distress.    Appearance: She is well-developed. She is not diaphoretic.  HENT:     Head: Normocephalic and atraumatic.  Eyes:     General: No scleral icterus.    Conjunctiva/sclera: Conjunctivae normal.  Cardiovascular:     Rate and Rhythm: Normal rate and regular rhythm.     Heart sounds: Normal heart sounds. No murmur heard. No friction rub. No gallop.   Pulmonary:     Effort: Pulmonary effort is normal. No respiratory distress.     Breath sounds: Normal breath sounds.  Abdominal:     General: Bowel sounds are normal. There is no distension.      Palpations: Abdomen is soft. There is no mass.     Tenderness: There is abdominal tenderness in the right upper quadrant, right lower quadrant and epigastric area. There is guarding and rebound.  Musculoskeletal:     Cervical back: Normal range of motion.  Skin:    General: Skin is warm and dry.  Neurological:     Mental Status: She is alert and oriented to person, place, and time.  Psychiatric:        Behavior: Behavior normal.     ED Results / Procedures / Treatments   Labs (all labs  ordered are listed, but only abnormal results are displayed) Labs Reviewed  C DIFFICILE QUICK SCREEN W PCR REFLEX  CBC WITH DIFFERENTIAL/PLATELET  COMPREHENSIVE METABOLIC PANEL  LIPASE, BLOOD  URINALYSIS, ROUTINE W REFLEX MICROSCOPIC  I-STAT BETA HCG BLOOD, ED ([facility], [facility], [facility] ONLY)    EKG None  Radiology No results found.  Procedures Procedures   Medications Ordered in ED Medications  morphine 4 MG/ML injection 4 mg (has no administration in time range)  ondansetron (ZOFRAN) injection 4 mg (has no administration in time range)    ED Course  I have reviewed the triage vital signs and the nursing notes.  Pertinent labs & imaging results that were available during my care of the patient were reviewed by me and considered in my medical decision making (see chart for details).  Clinical Course as of 08/23/20 1859  Wed Aug 23, 2020  1129 Potassium(!): 3.1 [AH]  1129 Sodium(!): 131 [AH]  1129 Creatinine(!): 1.05 [AH]    Clinical Course User Index [AH] Arthor Captain, PA-C   MDM Rules/Calculators/A&P                          50 year old female here with abdominal pain and diarrhea.  This has been ongoing for 1 week, now with abdominal pain and vomiting. The differential diagnosis of diarrhea includes but is not limited to Viral- norovirus/rotavirus; Bacterial-Campylobacter,Shigella, Salmonella, Escherichia coli, E. coli 0157:H7, Yersinia enterocolitica, Vibrio cholerae, Clostridium  difficile. Parasitic- Giardia lamblia, Cryptosporidium,Entamoeba histolytica,Cyclospora, Microsporidium. Toxin- Staphylococcus aureus, Bacillus cereus. Noninfectious causes include GI Bleed, Appendicitis, Mesenteric Ischemia, Diverticulitis, Adrenal Crisis, Thyroid Storm, Toxicologic exposures, Antibiotic or drug-associated, inflammatory bowel disease. I ordered and reviewed labs that include CBC with mild leukopenia of insignificant value, CMP with mild hypokalemia.  This is likely due to GI loss.  Plan of care hCG negative, C. difficile is negative, gastrointestinal PCR is pending. I ordered and reviewed a CT abdomen pelvis which shows no acute abnormalities.  Patient's pain is only moderately improved.  No findings on today's work-up show emergent cause of her abdominal pain.  Will treat symptomatically. PDMP reviewed during this encounter. Discussed outpatient follow-up and return precautions.  Patient appears appropriate for discharge at this time  Final Clinical Impression(s) / ED Diagnoses Final diagnoses:  None    Rx / DC Orders ED Discharge Orders    None       Arthor Captain, PA-C 08/23/20 Pauline Aus, MD 08/24/20 [phone]

## 2020-08-24 LAB — GASTROINTESTINAL PANEL BY PCR, STOOL (REPLACES STOOL CULTURE)

## 2021-02-13 ENCOUNTER — Other Ambulatory Visit: Payer: Self-pay | Admitting: Family Medicine

## 2021-02-13 DIAGNOSIS — Z1231 Encounter for screening mammogram for malignant neoplasm of breast: Secondary | ICD-10-CM

## 2021-06-16 ENCOUNTER — Other Ambulatory Visit: Payer: Self-pay

## 2021-06-16 ENCOUNTER — Emergency Department (HOSPITAL_COMMUNITY): Payer: PRIVATE HEALTH INSURANCE

## 2021-06-16 ENCOUNTER — Emergency Department (HOSPITAL_COMMUNITY)
Admission: EM | Admit: 2021-06-16 | Discharge: 2021-06-17 | Disposition: A | Discharge status: Home / Self Care | Payer: PRIVATE HEALTH INSURANCE | Attending: Emergency Medicine | Admitting: Emergency Medicine

## 2021-06-16 DIAGNOSIS — U071 COVID-19: Secondary | ICD-10-CM | POA: Insufficient documentation

## 2021-06-16 DIAGNOSIS — E119 Type 2 diabetes mellitus without complications: Secondary | ICD-10-CM | POA: Diagnosis not present

## 2021-06-16 DIAGNOSIS — R252 Cramp and spasm: Secondary | ICD-10-CM | POA: Insufficient documentation

## 2021-06-16 DIAGNOSIS — R0602 Shortness of breath: Secondary | ICD-10-CM | POA: Diagnosis present

## 2021-06-16 DIAGNOSIS — R0789 Other chest pain: Secondary | ICD-10-CM

## 2021-06-16 LAB — CBC WITH DIFFERENTIAL/PLATELET
Abs Immature Granulocytes: 0.02 10*3/uL (ref 0.00–0.07)
Basophils Absolute: 0 10*3/uL (ref 0.0–0.1)
Basophils Relative: 0 %
Eosinophils Absolute: 0.1 10*3/uL (ref 0.0–0.5)
Eosinophils Relative: 3 %
HCT: 40.4 % (ref 36.0–46.0)
Hemoglobin: 13.6 g/dL (ref 12.0–15.0)
Immature Granulocytes: 1 %
Lymphocytes Relative: 36 %
Lymphs Abs: 1.6 10*3/uL (ref 0.7–4.0)
MCH: 29.6 pg (ref 26.0–34.0)
MCHC: 33.7 g/dL (ref 30.0–36.0)
MCV: 87.8 fL (ref 80.0–100.0)
Monocytes Absolute: 0.4 10*3/uL (ref 0.1–1.0)
Monocytes Relative: 9 %
Neutro Abs: 2.3 10*3/uL (ref 1.7–7.7)
Neutrophils Relative %: 51 %
Platelets: 189 10*3/uL (ref 150–400)
RBC: 4.6 MIL/uL (ref 3.87–5.11)
RDW: 13.1 % (ref 11.5–15.5)
WBC: 4.4 10*3/uL (ref 4.0–10.5)
nRBC: 0 % (ref 0.0–0.2)

## 2021-06-16 NOTE — ED Provider Triage Note (Signed)
Emergency Medicine Provider Triage Evaluation Note  [name] , a 51 y.o. female  was evaluated in triage.  Pt complains of shortness of breath, chest pain.  Reports recent diagnosis of COVID Thursday.  States she was started on Paxlovid and after her first dose she became nauseous and stopped taking it..  States chest pain started today and is left-sided, is radiating through her left shoulder blade is right upper extremity cramping.  Headedness, palpitations, abdominal pain  Review of Systems  Positive: As above Negative: As above  Physical Exam  BP 109/82 (BP Location: Left Arm)    Pulse 79    Temp 98.7 F (37.1 C) (Oral)    Resp (!) 22    SpO2 98%  Gen:   Awake, no distress   Resp:  Normal effort  MSK:   Moves extremities without difficulty Other:    Medical Decision Making  Medically screening exam initiated at 11:18 PM.  Appropriate orders placed.  Unnamed D Segars was informed that the remainder of the evaluation will be completed by another provider, this initial triage assessment does not replace that evaluation, and the importance of remaining in the ED until their evaluation is complete.     Evlyn Courier, PA-C 06/16/21 2319

## 2021-06-16 NOTE — ED Triage Notes (Signed)
Pt here from home for worsening shob and cp that started tonight, pt was diagnosed w/ covid on Thursday. Pt reports pain is L chest, radiating straight through to back, and having L arm cramping.

## 2021-06-17 ENCOUNTER — Emergency Department (HOSPITAL_COMMUNITY): Payer: PRIVATE HEALTH INSURANCE

## 2021-06-17 LAB — BASIC METABOLIC PANEL
Anion gap: 8 (ref 5–15)
BUN: 10 mg/dL (ref 6–20)
CO2: 24 mmol/L (ref 22–32)
Calcium: 9 mg/dL (ref 8.9–10.3)
Chloride: 104 mmol/L (ref 98–111)
Creatinine, Ser: 0.8 mg/dL (ref 0.44–1.00)
GFR, Estimated: >60 mL/min (ref >60)
Glucose, Bld: 229 mg/dL — ABNORMAL HIGH (ref 70–99)
Potassium: 3.9 mmol/L (ref 3.5–5.1)
Sodium: 136 mmol/L (ref 135–145)

## 2021-06-17 LAB — RESP PANEL BY RT-PCR (FLU A&B, COVID) ARPGX2
Influenza A by PCR: NEGATIVE
Influenza B by PCR: NEGATIVE
SARS Coronavirus 2 by RT PCR: POSITIVE — AB

## 2021-06-17 LAB — TROPONIN I (HIGH SENSITIVITY)
Troponin I (High Sensitivity): 3 ng/L (ref <18)
Troponin I (High Sensitivity): <2 ng/L (ref <18)

## 2021-06-17 MED ORDER — BENZONATATE 100 MG PO CAPS: 100.0000 mg | ORAL_CAPSULE | Freq: Three times a day (TID) | ORAL | 0 refills | Status: AC

## 2021-06-17 MED ORDER — IOHEXOL 350 MG/ML SOLN
64.0000 mL | Freq: Once | INTRAVENOUS | Status: AC | PRN
  Administered 2021-06-17: 64 mL via INTRAVENOUS

## 2021-06-17 MED ORDER — ONDANSETRON HCL 4 MG PO TABS
4.0000 mg | ORAL_TABLET | Freq: Four times a day (QID) | ORAL | 0 refills | Status: AC
Start: 2021-06-17 — End: ?

## 2021-06-17 MED ORDER — FENTANYL CITRATE PF 50 MCG/ML IJ SOSY
50.0000 ug | PREFILLED_SYRINGE | Freq: Once | INTRAMUSCULAR | Status: AC
  Administered 2021-06-17: 50 ug via INTRAVENOUS
  Filled 2021-06-17: qty 1

## 2021-06-17 MED ORDER — IBUPROFEN 600 MG PO TABS: 600.0000 mg | ORAL_TABLET | Freq: Four times a day (QID) | ORAL | 0 refills | Status: AC | PRN

## 2021-06-17 MED ORDER — MOLNUPIRAVIR EUA 200MG CAPSULE: 4.0000 | ORAL_CAPSULE | Freq: Two times a day (BID) | ORAL | 0 refills | Status: AC

## 2021-06-17 NOTE — ED Provider Notes (Addendum)
[facility] EMERGENCY DEPARTMENT Provider Note   CSN: [id number] Arrival date & time: 06/16/21  2246     History  Chief Complaint  Patient presents with   Chest Pain   Shortness of Breath    [name] is a 51 y.o. female.  HPI   Pt is a 51 y/o female with a h/o anxiety, DM, depression, who presents to the ED for eval of chest pain and shortness of breath that started today. She had associated cramping in the left arm as well. Her symptoms have been constant since onset and feels like a stabbing pain in the chest. Pain radiates to the back as well. She has some pleuritic pain as well. She notes she was dx with covid earlier this week and has had a cough, rhinorrhea and chills. Reports some intermittent ble swelling since having covid earlier this year.   Does note that she was started on paxlovid and was vomiting after taking it  Home Medications Prior to Admission medications   Medication Sig Start Date End Date Taking? Authorizing Provider  benzonatate (TESSALON) 100 MG capsule Take 1 capsule (100 mg total) by mouth every 8 (eight) hours for 5 days. 06/17/21 06/22/21 Yes Aide Wojnar S, PA-C  ibuprofen (ADVIL) 600 MG tablet Take 1 tablet (600 mg total) by mouth every 6 (six) hours as needed. 06/17/21  Yes Darryll Raju S, PA-C  molnupiravir EUA (LAGEVRIO) 200 mg CAPS capsule Take 4 capsules (800 mg total) by mouth 2 (two) times daily for 5 days. 06/17/21 06/22/21 Yes Editha Bridgeforth S, PA-C  ondansetron (ZOFRAN) 4 MG tablet Take 1 tablet (4 mg total) by mouth every 6 (six) hours. 06/17/21  Yes Ji Feldner S, PA-C  albuterol (PROVENTIL HFA;VENTOLIN HFA) 108 (90 BASE) MCG/ACT inhaler Inhale 2 puffs into the lungs every 6 (six) hours as needed for wheezing or shortness of breath. 02/26/15   Phineas Semen, MD  albuterol (VENTOLIN HFA) 108 (90 Base) MCG/ACT inhaler Inhale 2 puffs into the lungs every 6 (six) hours as needed for wheezing or shortness of breath.  08/29/18   Arnaldo Natal, MD  ALPRAZolam Prudy Feeler) 0.25 MG tablet Take 0.25 mg by mouth 2 (two) times daily as needed. For anxiety    [provider]  atorvastatin (LIPITOR) 40 MG tablet Take 40 mg by mouth daily. 08/06/18   [provider]  Cholecalciferol (VITAMIN D3) 1.25 MG (50000 UT) CAPS Take 50,000 Units by mouth once a week. 08/06/18   [provider]  citalopram (CELEXA) 40 MG tablet Take 40 mg by mouth daily. 08/15/18   [provider]  cyclobenzaprine (FLEXERIL) 10 MG tablet Take 10 mg by mouth 3 (three) times daily.    [provider]  dicyclomine (BENTYL) 20 MG tablet Take 1 tablet (20 mg total) by mouth 2 (two) times daily. 08/23/20   Arthor Captain, PA-C  diphenhydrAMINE (SOMINEX) 25 MG tablet Take 25 mg by mouth daily as needed. For sleep    [provider]  HYDROcodone-acetaminophen (NORCO/VICODIN) 5-325 MG per tablet Take 1 tablet by mouth 2 (two) times daily as needed. For lower quadrant pain    [provider]  ibuprofen (ADVIL,MOTRIN) 800 MG tablet Take 1 tablet (800 mg total) by mouth every 8 (eight) hours as needed for moderate pain. Patient not taking: Reported on 08/29/2018 12/08/15   Irean Hong, MD  Melatonin 5 MG TABS Take 1 tablet by mouth.    [provider]  meloxicam [facility])  15 MG tablet Take 15 mg by mouth daily.    [provider]  metoCLOPramide (REGLAN) 5 MG tablet Take 1 tablet (5 mg total) by mouth every 8 (eight) hours as needed. 08/14/11 08/24/11  Dellinger, Tora Kindred, PA-C  ondansetron (ZOFRAN) 4 MG tablet Take 1 tablet (4 mg total) by mouth every 8 (eight) hours as needed for nausea or vomiting. 08/23/20   Arthor Captain, PA-C  oxycodone (OXY-IR) 5 MG capsule Take 5 mg by mouth every 6 (six) hours as needed. For lower quadrant pain    [provider]  oxyCODONE-acetaminophen (PERCOCET) 5-325 MG tablet Take 1 tablet by mouth every 6 (six) hours as needed for severe pain. 08/23/20    Harris, Cammy Copa, PA-C  QUEtiapine (SEROQUEL) 100 MG tablet Take 100 mg by mouth at bedtime.    [provider]      Allergies    Promethazine hcl    Review of Systems   Review of Systems See HPI for pertinent positives or negatives.   Physical Exam Updated Vital Signs BP 122/80    Pulse 77    Temp 98.7 F (37.1 C) (Oral)    Resp 15    SpO2 98%  Physical Exam Vitals and nursing note reviewed.  Constitutional:      General: She is not in acute distress.    Appearance: She is well-developed.  HENT:     Head: Normocephalic and atraumatic.  Eyes:     Conjunctiva/sclera: Conjunctivae normal.  Cardiovascular:     Rate and Rhythm: Normal rate and regular rhythm.     Heart sounds: Normal heart sounds. No murmur heard. Pulmonary:     Effort: Pulmonary effort is normal. No respiratory distress.     Breath sounds: Normal breath sounds. No decreased breath sounds, wheezing or rhonchi.  Chest:     Chest wall: No tenderness.  Abdominal:     Palpations: Abdomen is soft.     Tenderness: There is no abdominal tenderness.  Musculoskeletal:        General: No swelling.     Cervical back: Neck supple.     Comments: Trace edema  Skin:    General: Skin is warm and dry.     Capillary Refill: Capillary refill takes less than 2 seconds.  Neurological:     Mental Status: She is alert.  Psychiatric:        Mood and Affect: Mood normal.    ED Results / Procedures / Treatments   Labs (all labs ordered are listed, but only abnormal results are displayed) Labs Reviewed  RESP PANEL BY RT-PCR (FLU A&B, COVID) ARPGX2 - Abnormal; Notable for the following components:      Result Value   SARS Coronavirus 2 by RT PCR POSITIVE (*)    All other components within normal limits  BASIC METABOLIC PANEL - Abnormal; Notable for the following components:   Glucose, Bld 229 (*)    All other components within normal limits  CBC WITH DIFFERENTIAL/PLATELET  TROPONIN I (HIGH SENSITIVITY)   TROPONIN I (HIGH SENSITIVITY)    EKG EKG Interpretation  Date/Time:  Saturday June 16 2021 22:58:20 EST Ventricular Rate:  83 PR Interval:  136 QRS Duration: 80 QT Interval:  378 QTC Calculation: 444 R Axis:   -1 Text Interpretation: Normal sinus rhythm Nonspecific ST abnormality Abnormal ECG When compared with ECG of 26-Feb-2015 10:39, PREVIOUS ECG IS PRESENT Confirmed by Marily Memos [phone]) on 06/16/2021 11:48:14 PM  Radiology DG Chest 2 View  Result  Date: 06/16/2021 CLINICAL DATA:  Chest pain, shortness of breath, and cough. EXAM: CHEST - 2 VIEW COMPARISON:  08/29/2018 FINDINGS: The heart size and mediastinal contours are within normal limits. Both lungs are clear. The visualized skeletal structures are unremarkable. IMPRESSION: No active cardiopulmonary disease. Electronically Signed   By: Burman Nieves M.D.   On: 06/16/2021 23:53   CT Angio Chest PE W and/or Wo Contrast  Result Date: 06/17/2021 CLINICAL DATA:  Pulmonary embolism (PE) suspected, high prob. Shortness of breath, chest pain. COVID positive EXAM: CT ANGIOGRAPHY CHEST WITH CONTRAST TECHNIQUE: Multidetector CT imaging of the chest was performed using the standard protocol during bolus administration of intravenous contrast. Multiplanar CT image reconstructions and MIPs were obtained to evaluate the vascular anatomy. RADIATION DOSE REDUCTION: This exam was performed according to the departmental dose-optimization program which includes automated exposure control, adjustment of the mA and/or kV according to patient size and/or use of iterative reconstruction technique. CONTRAST:  56mL OMNIPAQUE IOHEXOL 350 MG/ML SOLN COMPARISON:  05/23/2011 FINDINGS: Cardiovascular: No filling defects in the pulmonary arteries to suggest pulmonary emboli. Heart is normal size. Aorta is normal caliber. Mediastinum/Nodes: No mediastinal, hilar, or axillary adenopathy. Trachea and esophagus are unremarkable. Thyroid unremarkable. Lungs/Pleura:  Dependent ground-glass opacities in the lower lobes could reflect atelectasis. Somewhat nodular ground-glass opacities in the left lower lobe posteriorly could reflect early infiltrates. No effusions. Upper Abdomen: No acute findings Musculoskeletal: Chest wall soft tissues are unremarkable. No acute bony abnormality. Review of the MIP images confirms the above findings. IMPRESSION: No evidence of pulmonary embolus. Scattered ground-glass opacities in the lungs could reflect atelectasis or early infiltrates. Electronically Signed   By: Charlett Nose M.D.   On: 06/17/2021 02:30    Procedures Procedures    Medications Ordered in ED Medications  fentaNYL (SUBLIMAZE) injection 50 mcg (50 mcg Intravenous Given 06/17/21 0133)  iohexol (OMNIPAQUE) 350 MG/ML injection 64 mL (64 mLs Intravenous Contrast Given 06/17/21 0222)    ED Course/ Medical Decision Making/ A&P                           Medical Decision Making Amount and/or Complexity of Data Reviewed Radiology: ordered.  Risk Prescription drug management.   This patient presents to the ED for concern of chest pain and shortness of breath, this involves an extensive number of treatment options, and is a complaint that carries with it a high risk of complications and morbidity.  The differential diagnosis includes ACS, PE, dissection, esophageal rupture, pleurisy, pneumonia, pneumothorax  Comorbidities that complicate the patient evaluation: Patients presentation is complicated by their history of COVID  Social Determinants of Health:  Additional history obtained:  Records reviewed Care Everywhere/External Records  Lab Tests: I Ordered, and personally interpreted labs.  The pertinent results include:   CBC wnl BMP with hyperglycemia, otherwise unremarkable Trop neg x2 COVID negative  EKG - Normal sinus rhythm Nonspecific ST abnormality Abnormal ECG When compared with ECG of 26-Feb-2015 10:39, PREVIOUS ECG IS PRESENT   Imaging  Studies ordered: I ordered imaging studies including CT scan chest and X-ray chest   I independently visualized and interpreted imaging which showed  CXR - No active cardiopulmonary disease. CT chest - No evidence of pulmonary embolus. Scattered ground-glass opacities in the lungs could reflect atelectasis or early infiltrates. I agree with the radiologist interpretation  Cardiac Monitoring: The patient was maintained on a cardiac monitor.  I personally viewed and interpreted the cardiac monitor which showed  an underlying rhythm of:  sinus rhythm  Medicines ordered and prescription drug management: I ordered medication including fentanyl  for pain  Reevaluation of the patient after these medicines showed that the patient    improved   Reevaluation: After the interventions noted above, I reevaluated the patient and found that they have :improved  Complexity of problems addressed: Patients presentation is most consistent with  acute presentation with potential threat to life or bodily function.   Disposition: After consideration of the diagnostic results and the patients response to treatment,  I feel that the patent would benefit from discharge   .  I suspect patient's symptoms are related to pleurisy and she is likely developing an early infiltrate as well related to her COVID.  She did not tolerate pack Slo-Bid at home she was offered molnupirovir for her symptoms.  She will also be given supportive medications for home.  Anti-inflammatories were given as well.  Patient to follow-up with PCP closely and return to the ED for any new or worsening symptoms.  She voices understanding and is in agreement.  All questions answered.  Patient stable for discharge.   Final Clinical Impression(s) / ED Diagnoses Final diagnoses:  COVID  Atypical chest pain    Rx / DC Orders ED Discharge Orders          Ordered    molnupiravir EUA (LAGEVRIO) 200 mg CAPS capsule  2 times daily         06/17/21 0309    ondansetron (ZOFRAN) 4 MG tablet  Every 6 hours        06/17/21 0309    benzonatate (TESSALON) 100 MG capsule  Every 8 hours        06/17/21 0309    ibuprofen (ADVIL) 600 MG tablet  Every 6 hours PRN        06/17/21 0309              Dailey Buccheri S, PA-C 06/17/21 0309    Silvio Sausedo S, PA-C 06/17/21 0310    Palumbo, April, MD 06/17/21 [id number]

## 2021-06-17 NOTE — ED Notes (Signed)
Pt discharged and ambulated out of the ED without difficulty. 

## 2021-06-17 NOTE — Discharge Instructions (Signed)
You may alternate taking Tylenol and Ibuprofen as needed for pain control. You may take 400-600 mg of ibuprofen every 6 hours and [id number] mg of Tylenol every 6 hours. Do not exceed 4000 mg of Tylenol daily as this can lead to liver damage. Also, make sure to take Ibuprofen with meals as it can cause an upset stomach. Do not take other NSAIDs while taking Ibuprofen such as (Aleve, Naprosyn, Aspirin, Celebrex, etc) and do not take more than the prescribed dose as this can lead to ulcers and bleeding in your GI tract. You may use warm and cold compresses to help with your symptoms.   Take molnupirovir as directed for your covid  You were given a prescription for zofran to help with your nausea. Please take as directed.   Please follow up with your primary doctor within the next 7-10 days for re-evaluation and further treatment of your symptoms.   Please return to the ER sooner if you have any new or worsening symptoms.

## 2021-06-28 ENCOUNTER — Other Ambulatory Visit: Payer: Self-pay | Admitting: Family Medicine

## 2021-07-02 ENCOUNTER — Other Ambulatory Visit: Payer: Self-pay | Admitting: Family Medicine

## 2021-07-02 DIAGNOSIS — N6324 Unspecified lump in the left breast, lower inner quadrant: Secondary | ICD-10-CM

## 2021-07-13 ENCOUNTER — Ambulatory Visit
Admission: RE | Admit: 2021-07-13 | Discharge: 2021-07-13 | Disposition: A | Discharge status: Home / Self Care | Payer: PRIVATE HEALTH INSURANCE | Source: Ambulatory Visit | Attending: Family Medicine | Admitting: Family Medicine

## 2021-07-13 ENCOUNTER — Other Ambulatory Visit: Payer: Self-pay

## 2021-07-13 DIAGNOSIS — N6324 Unspecified lump in the left breast, lower inner quadrant: Secondary | ICD-10-CM

## 2021-08-31 ENCOUNTER — Other Ambulatory Visit: Payer: Self-pay

## 2021-08-31 ENCOUNTER — Encounter: Payer: Self-pay | Admitting: Emergency Medicine

## 2021-08-31 ENCOUNTER — Emergency Department
Admission: EM | Admit: 2021-08-31 | Discharge: 2021-08-31 | Disposition: A | Discharge status: Home / Self Care | Payer: PRIVATE HEALTH INSURANCE | Attending: Emergency Medicine | Admitting: Emergency Medicine

## 2021-08-31 DIAGNOSIS — E119 Type 2 diabetes mellitus without complications: Secondary | ICD-10-CM | POA: Diagnosis not present

## 2021-08-31 DIAGNOSIS — K029 Dental caries, unspecified: Secondary | ICD-10-CM | POA: Diagnosis not present

## 2021-08-31 DIAGNOSIS — R220 Localized swelling, mass and lump, head: Secondary | ICD-10-CM | POA: Diagnosis present

## 2021-08-31 DIAGNOSIS — K0889 Other specified disorders of teeth and supporting structures: Secondary | ICD-10-CM

## 2021-08-31 MED ORDER — OXYCODONE-ACETAMINOPHEN 5-325 MG PO TABS: 1.0000 | ORAL_TABLET | ORAL | 0 refills | Status: AC | PRN

## 2021-08-31 MED ORDER — LIDOCAINE VISCOUS HCL 2 % MT SOLN
15.0000 mL | Freq: Once | OROMUCOSAL | Status: AC
  Administered 2021-08-31: 15 mL via OROMUCOSAL
  Filled 2021-08-31: qty 15

## 2021-08-31 MED ORDER — OXYCODONE-ACETAMINOPHEN 5-325 MG PO TABS
1.0000 | ORAL_TABLET | Freq: Once | ORAL | Status: AC
  Administered 2021-08-31: 1 via ORAL
  Filled 2021-08-31: qty 1

## 2021-08-31 NOTE — Discharge Instructions (Signed)
You may take Ibuprofen as needed for pain; Percocet as needed for more severe pain.  Return to the ER for worsening symptoms, persistent vomiting, difficulty breathing or other concerns. ?

## 2021-08-31 NOTE — ED Triage Notes (Signed)
Pt to ED from home c/o lower left dental pain for a couple days.  Known tooth issues.  Dentist appt in the morning but can't stand the pain.  Some swelling noted, chest rise even and unlabored, maintaining secretions. ?

## 2021-08-31 NOTE — ED Provider Notes (Signed)
? ?[facility] ?Provider Note ? ? ? Event Date/Time  ? First MD Initiated Contact with Patient 08/31/21 0108   ?  (approximate) ? ? ?History  ? ?Dental Pain ? ? ?HPI ? ?[name] is a 51 y.o. female who presents to the ED from home with a chief complaint of dental pain.  Patient reports left lower dental pain x2 days.  Have tried over-the-counter ibuprofen, and use all gels without relief of symptoms.  Has an appointment with her dentist this morning but could not sleep due to the pain.  Endorses mild swelling to left lower jaw.  Denies fever, breathing difficulty, vomiting. ?  ? ? ?Past Medical History  ? ?Past Medical History:  ?Diagnosis Date  ? Anxiety   ? Blood dyscrasia   ? hx of clotting disorder  ? Clotting disorder ([place])   ? Depression   ? Diabetes mellitus without complication ([place])   ? Ovarian cyst   ? S/P laparoscopic cholecystectomy   ? ? ? ?Active Problem List  ? ?Patient Active Problem List  ? Diagnosis Date Noted  ? Diarrhea 08/12/2011  ? Nausea and vomiting 08/04/2011  ? Abdominal pain of unknown etiology 08/04/2011  ? Hypokalemia 08/04/2011  ? Dehydration 08/04/2011  ? Ovarian cystic mass 08/04/2011  ? Hyponatremia 08/04/2011  ? Back pain 08/04/2011  ? Scoliosis 08/04/2011  ? ? ? ?Past Surgical History  ? ?Past Surgical History:  ?Procedure Laterality Date  ? ABDOMINAL HYSTERECTOMY    ? CEASAREAN    ? CHOLECYSTECTOMY    ? ? ? ?Home Medications  ? ?Prior to Admission medications   ?Medication Sig Start Date End Date Taking? Authorizing Provider  ?oxyCODONE-acetaminophen (PERCOCET/ROXICET) 5-325 MG tablet Take 1 tablet by mouth every 4 (four) hours as needed for severe pain. 08/31/21  Yes Paulette Blanch, MD  ?albuterol (PROVENTIL HFA;VENTOLIN HFA) 108 (90 BASE) MCG/ACT inhaler Inhale 2 puffs into the lungs every 6 (six) hours as needed for wheezing or shortness of breath. 02/26/15   Nance Pear, MD  ?albuterol (VENTOLIN HFA) 108 (90 Base) MCG/ACT inhaler Inhale 2 puffs into  the lungs every 6 (six) hours as needed for wheezing or shortness of breath. 08/29/18   Nena Polio, MD  ?ALPRAZolam Duanne Moron) 0.25 MG tablet Take 0.25 mg by mouth 2 (two) times daily as needed. For anxiety    [provider]  ?atorvastatin (LIPITOR) 40 MG tablet Take 40 mg by mouth daily. 08/06/18   [provider]  ?Cholecalciferol (VITAMIN D3) 1.25 MG (50000 UT) CAPS Take 50,000 Units by mouth once a week. 08/06/18   [provider]  ?citalopram (CELEXA) 40 MG tablet Take 40 mg by mouth daily. 08/15/18   [provider]  ?cyclobenzaprine (FLEXERIL) 10 MG tablet Take 10 mg by mouth 3 (three) times daily.    [provider]  ?dicyclomine (BENTYL) 20 MG tablet Take 1 tablet (20 mg total) by mouth 2 (two) times daily. 08/23/20   Margarita Mail, PA-C  ?diphenhydrAMINE (SOMINEX) 25 MG tablet Take 25 mg by mouth daily as needed. For sleep    [provider]  ?HYDROcodone-acetaminophen (NORCO/VICODIN) 5-325 MG per tablet Take 1 tablet by mouth 2 (two) times daily as needed. For lower quadrant pain    [provider]  ?ibuprofen (ADVIL) 600 MG tablet Take 1 tablet (600 mg total) by mouth every 6 (six) hours as needed. 06/17/21   Couture, Cortni S, PA-C  ?ibuprofen (ADVIL,MOTRIN) 800 MG tablet Take 1  tablet (800 mg total) by mouth every 8 (eight) hours as needed for moderate pain. ?Patient not taking: Reported on 08/29/2018 12/08/15   Paulette Blanch, MD  ?Melatonin 5 MG TABS Take 1 tablet by mouth.    [provider]  ?meloxicam (MOBIC) 15 MG tablet Take 15 mg by mouth daily.    [provider]  ?metoCLOPramide (REGLAN) 5 MG tablet Take 1 tablet (5 mg total) by mouth every 8 (eight) hours as needed. 08/14/11 08/24/11  Dellinger, Bobby Rumpf, PA-C  ?ondansetron (ZOFRAN) 4 MG tablet Take 1 tablet (4 mg total) by mouth every 8 (eight) hours as needed for nausea or vomiting. 08/23/20   Margarita Mail, PA-C  ?ondansetron (ZOFRAN) 4 MG tablet Take 1 tablet (4 mg  total) by mouth every 6 (six) hours. 06/17/21   Couture, Cortni S, PA-C  ?oxycodone (OXY-IR) 5 MG capsule Take 5 mg by mouth every 6 (six) hours as needed. For lower quadrant pain    [provider]  ?QUEtiapine (SEROQUEL) 100 MG tablet Take 100 mg by mouth at bedtime.    [provider]  ? ? ? ?Allergies  ?Promethazine hcl ? ? ?Family History  ? ?Family History  ?Problem Relation Age of Onset  ? Breast cancer Mother 33  ? ? ? ?Physical Exam  ?Triage Vital Signs: ?ED Triage Vitals  ?Enc Vitals Group  ?   BP 08/31/21 0058 (!) 150/83  ?   Pulse Rate 08/31/21 0058 78  ?   Resp 08/31/21 0058 16  ?   Temp 08/31/21 0058 98.2 ?F (36.8 ?C)  ?   Temp Source 08/31/21 0058 Oral  ?   SpO2 08/31/21 0058 100 %  ?   Weight 08/31/21 0056 201 lb (91.2 kg)  ?   Height 08/31/21 0056 5\' 7"  (1.702 m)  ?   Head Circumference --   ?   Peak Flow --   ?   Pain Score 08/31/21 0056 10  ?   Pain Loc --   ?   Pain Edu? --   ?   Excl. in [place]? --   ? ? ?Updated Vital Signs: ?BP (!) 150/83 (BP Location: Right Arm)   Pulse 78   Temp 98.2 ?F (36.8 ?C) (Oral)   Resp 16   Ht 5\' 7"  (1.702 m)   Wt 91.2 kg   SpO2 100%   BMI 31.48 kg/m?  ? ? ?General: Awake, mild distress.  ?CV:  Good peripheral perfusion.  ?Resp:  Normal effort.  ?Abd:  No distention.  ?Other:  Widespread dental decay.  No posterior left molars.  Mild swelling to left lower incisor/gum. ? ? ?ED Results / Procedures / Treatments  ?Labs ?(all labs ordered are listed, but only abnormal results are displayed) ?Labs Reviewed - No data to display ? ? ?EKG ? ?None ? ? ?RADIOLOGY ?None ? ? ?Official radiology report(s): ?No results found. ? ? ?PROCEDURES: ? ?Critical Care performed: No ? ?Procedures ? ? ?MEDICATIONS ORDERED IN ED: ?Medications  ?lidocaine (XYLOCAINE) 2 % viscous mouth solution 15 mL (has no administration in time range)  ?oxyCODONE-acetaminophen (PERCOCET/ROXICET) 5-325 MG per tablet 1 tablet (has no administration in time range)  ? ? ? ?IMPRESSION / MDM /  ASSESSMENT AND PLAN / ED COURSE  ?I reviewed the triage vital signs and the nursing notes. ?             ?               ?  51 year old female presenting with dentalgia secondary to dental caries.  She is already taking amoxicillin.  Will administer lidocaine rinse, Percocet.  Patient has dental appointment in the morning.  Strict return precautions given.  Patient and family member verbalized understanding and agree with plan of care. ? ?FINAL CLINICAL IMPRESSION(S) / ED DIAGNOSES  ? ?Final diagnoses:  ?Dentalgia  ?Dental caries  ? ? ? ?Rx / DC Orders  ? ?ED Discharge Orders   ? ?      Ordered  ?  oxyCODONE-acetaminophen (PERCOCET/ROXICET) 5-325 MG tablet  Every 4 hours PRN       ? 08/31/21 0123  ? ?  ?  ? ?  ? ? ? ?Note:  This document was prepared using Dragon voice recognition software and may include unintentional dictation errors. ?  ?Paulette Blanch, MD ?08/31/21 [phone] ? ?

## 2021-10-29 ENCOUNTER — Encounter: Payer: Self-pay | Admitting: Emergency Medicine

## 2021-10-29 ENCOUNTER — Ambulatory Visit
Admission: EM | Admit: 2021-10-29 | Discharge: 2021-10-29 | Disposition: A | Discharge status: Home / Self Care | Payer: PRIVATE HEALTH INSURANCE | Attending: Family Medicine | Admitting: Family Medicine

## 2021-10-29 DIAGNOSIS — S61219A Laceration without foreign body of unspecified finger without damage to nail, initial encounter: Secondary | ICD-10-CM

## 2021-10-29 DIAGNOSIS — L089 Local infection of the skin and subcutaneous tissue, unspecified: Secondary | ICD-10-CM

## 2021-10-29 DIAGNOSIS — R600 Localized edema: Secondary | ICD-10-CM

## 2021-10-29 DIAGNOSIS — R21 Rash and other nonspecific skin eruption: Secondary | ICD-10-CM

## 2021-10-29 MED ORDER — TRIAMCINOLONE ACETONIDE 0.025 % EX CREA: 1.0000 | TOPICAL_CREAM | Freq: Three times a day (TID) | CUTANEOUS | 0 refills | Status: AC | PRN

## 2021-10-29 MED ORDER — SULFAMETHOXAZOLE-TRIMETHOPRIM 800-160 MG PO TABS: 1.0000 | ORAL_TABLET | Freq: Two times a day (BID) | ORAL | 0 refills | Status: AC

## 2021-10-29 NOTE — Discharge Instructions (Signed)
If leg swelling doesn't improve with Lasix follow-up with primary doctor. If right leg swelling worsens, or if you develop pain, go to the emergency department for further evaluation.

## 2021-10-29 NOTE — ED Triage Notes (Signed)
Pt cut her right pointer finger 2 days ago. She has some swelling and pain. Pt also has a rash on her right shin since last night.

## 2021-10-29 NOTE — ED Provider Notes (Signed)
[name]    CSN: [id number] Arrival date & time: 10/29/21  1135      History   Chief Complaint Chief Complaint  Patient presents with  . Finger Injury    HPI Britanie D Maxim is a 51 y.o. female.   HPI Patient presents for evaluation of 2 day old cut on her right index finger. She reports cutting her finger on a can lid. She reports the pain involving her finger worsened over night  and now the cut is swollen and more painful. She is a diabetic and is concern for infection. She is up to date on her tetanus vaccine.  Rash and swelling of legs. Patient reports right leg swelling (worse than baseline) overnight. She has a history of bilateral lower leg edema and is proscribed lasix. She took 80 mg of lasix last night and swelling of  legs somewhat improved, however right leg continue to be more swollen than baseline. No injury or recent prolonged mobility.  Past Medical History:  Diagnosis Date  . Anxiety   . Blood dyscrasia    hx of clotting disorder  . Clotting disorder ([facility])   . Depression   . Diabetes mellitus without complication ([facility])   . Ovarian cyst   . S/P laparoscopic cholecystectomy     Patient Active Problem List   Diagnosis Date Noted  . Diarrhea 08/12/2011  . Nausea and vomiting 08/04/2011  . Abdominal pain of unknown etiology 08/04/2011  . Hypokalemia 08/04/2011  . Dehydration 08/04/2011  . Ovarian cystic mass 08/04/2011  . Hyponatremia 08/04/2011  . Back pain 08/04/2011  . Scoliosis 08/04/2011    Past Surgical History:  Procedure Laterality Date  . ABDOMINAL HYSTERECTOMY    . CEASAREAN    . CHOLECYSTECTOMY      OB History   No obstetric history on file.      Home Medications    Prior to Admission medications   Medication Sig Start Date End Date Taking? Authorizing Provider  sulfamethoxazole-trimethoprim (BACTRIM DS) 800-160 MG tablet Take 1 tablet by mouth 2 (two) times daily. 10/29/21  Yes Bing Neighbors, FNP  triamcinolone  (KENALOG) 0.025 % cream Apply 1 Application topically 3 (three) times daily as needed. For rash on legs. 10/29/21  Yes Bing Neighbors, FNP  albuterol (PROVENTIL HFA;VENTOLIN HFA) 108 (90 BASE) MCG/ACT inhaler Inhale 2 puffs into the lungs every 6 (six) hours as needed for wheezing or shortness of breath. 02/26/15   Phineas Semen, MD  albuterol (VENTOLIN HFA) 108 (90 Base) MCG/ACT inhaler Inhale 2 puffs into the lungs every 6 (six) hours as needed for wheezing or shortness of breath. 08/29/18   Arnaldo Natal, MD  ALPRAZolam Prudy Feeler) 0.25 MG tablet Take 0.25 mg by mouth 2 (two) times daily as needed. For anxiety    [provider]  atorvastatin (LIPITOR) 40 MG tablet Take 40 mg by mouth daily. 08/06/18   [provider]  Cholecalciferol (VITAMIN D3) 1.25 MG (50000 UT) CAPS Take 50,000 Units by mouth once a week. 08/06/18   [provider]  citalopram (CELEXA) 40 MG tablet Take 40 mg by mouth daily. 08/15/18   [provider]  cyclobenzaprine (FLEXERIL) 10 MG tablet Take 10 mg by mouth 3 (three) times daily.    [provider]  dicyclomine (BENTYL) 20 MG tablet Take 1 tablet (20 mg total) by mouth 2 (two) times daily. 08/23/20   Arthor Captain, PA-C  diphenhydrAMINE (SOMINEX) 25 MG tablet Take 25 mg by mouth  daily as needed. For sleep    [provider]  HYDROcodone-acetaminophen (NORCO/VICODIN) 5-325 MG per tablet Take 1 tablet by mouth 2 (two) times daily as needed. For lower quadrant pain    [provider]  ibuprofen (ADVIL) 600 MG tablet Take 1 tablet (600 mg total) by mouth every 6 (six) hours as needed. 06/17/21   Couture, Cortni S, PA-C  ibuprofen (ADVIL,MOTRIN) 800 MG tablet Take 1 tablet (800 mg total) by mouth every 8 (eight) hours as needed for moderate pain. Patient not taking: Reported on 08/29/2018 12/08/15   Irean Hong, MD  Melatonin 5 MG TABS Take 1 tablet by mouth.    [provider]  meloxicam (MOBIC) 15 MG tablet  Take 15 mg by mouth daily.    [provider]  metoCLOPramide (REGLAN) 5 MG tablet Take 1 tablet (5 mg total) by mouth every 8 (eight) hours as needed. 08/14/11 08/24/11  Dellinger, Tora Kindred, PA-C  ondansetron (ZOFRAN) 4 MG tablet Take 1 tablet (4 mg total) by mouth every 8 (eight) hours as needed for nausea or vomiting. 08/23/20   Arthor Captain, PA-C  ondansetron (ZOFRAN) 4 MG tablet Take 1 tablet (4 mg total) by mouth every 6 (six) hours. 06/17/21   Couture, Cortni S, PA-C  oxycodone (OXY-IR) 5 MG capsule Take 5 mg by mouth every 6 (six) hours as needed. For lower quadrant pain    [provider]  oxyCODONE-acetaminophen (PERCOCET/ROXICET) 5-325 MG tablet Take 1 tablet by mouth every 4 (four) hours as needed for severe pain. 08/31/21   Irean Hong, MD  QUEtiapine (SEROQUEL) 100 MG tablet Take 100 mg by mouth at bedtime.    [provider]    Family History Family History  Problem Relation Age of Onset  . Breast cancer Mother 54    Social History Social History   Tobacco Use  . Smoking status: Former    Packs/day: 0.00    Years: 20.00    Total pack years: 0.00    Types: Cigarettes  . Smokeless tobacco: Never  Vaping Use  . Vaping Use: Every day  Substance Use Topics  . Alcohol use: Yes    Comment: social  . Drug use: No     Allergies   Promethazine hcl   Review of Systems Review of Systems Pertinent negatives listed in HPI  Physical Exam Triage Vital Signs ED Triage Vitals  Enc Vitals Group     BP 10/29/21 1225 107/73     Pulse Rate 10/29/21 1225 92     Resp 10/29/21 1225 18     Temp 10/29/21 1225 98.6 F (37 C)     Temp Source 10/29/21 1225 Oral     SpO2 10/29/21 1225 94 %     Weight --      Height --      Head Circumference --      Peak Flow --      Pain Score 10/29/21 1223 6     Pain Loc --      Pain Edu? --      Excl. in [place]? --    No data found.  Updated Vital Signs BP 107/73 (BP Location: Left Arm)   Pulse 92   Temp 98.6  F (37 C) (Oral)   Resp 18   SpO2 94%   Visual Acuity Right Eye Distance:   Left Eye Distance:   Bilateral Distance:    Right Eye Near:   Left Eye Near:  Bilateral Near:     Physical Exam   UC Treatments / Results  Labs (all labs ordered are listed, but only abnormal results are displayed) Labs Reviewed - No data to display  EKG   Radiology No results found.  Procedures Procedures (including critical care time)  Medications Ordered in UC Medications - No data to display  Initial Impression / Assessment and Plan / UC Course  I have reviewed the triage vital signs and the nursing notes.  Pertinent labs & imaging results that were available during my care of the patient were reviewed by me and considered in my medical decision making (see chart for details).    Laceration infection of right index finger Bilateral leg edema, continue Lasix, if any red flag symptoms develop, ED. Regardless follow-up with PCP. Rash suspect vasculitis verses dermatitis, trial triamcinolone cream.  Final Clinical Impressions(s) / UC Diagnoses   Final diagnoses:  Laceration of finger with infection, initial encounter  Bilateral leg edema  Rash and nonspecific skin eruption     Discharge Instructions      If leg swelling doesn't improve with Lasix follow-up with primary doctor. If right leg swelling worsens, or if you develop pain, go to the emergency department for further evaluation.       ED Prescriptions     Medication Sig Dispense Auth. Provider   triamcinolone (KENALOG) 0.025 % cream Apply 1 Application topically 3 (three) times daily as needed. For rash on legs. 160 g Bing Neighbors, FNP   sulfamethoxazole-trimethoprim (BACTRIM DS) 800-160 MG tablet Take 1 tablet by mouth 2 (two) times daily. 20 tablet Bing Neighbors, FNP      PDMP not reviewed this encounter.

## 2021-11-27 ENCOUNTER — Encounter: Payer: Self-pay | Admitting: Emergency Medicine

## 2021-11-27 ENCOUNTER — Emergency Department
Admission: EM | Admit: 2021-11-27 | Discharge: 2021-11-27 | Disposition: A | Discharge status: Home / Self Care | Payer: Self-pay | Attending: Emergency Medicine | Admitting: Emergency Medicine

## 2021-11-27 ENCOUNTER — Other Ambulatory Visit: Payer: Self-pay

## 2021-11-27 ENCOUNTER — Emergency Department (HOSPITAL_COMMUNITY)
Admission: EM | Admit: 2021-11-27 | Discharge: 2021-11-27 | Disposition: A | Discharge status: Home / Self Care | Payer: Self-pay

## 2021-11-27 DIAGNOSIS — R21 Rash and other nonspecific skin eruption: Secondary | ICD-10-CM | POA: Insufficient documentation

## 2021-11-27 DIAGNOSIS — I788 Other diseases of capillaries: Secondary | ICD-10-CM | POA: Insufficient documentation

## 2021-11-27 DIAGNOSIS — R600 Localized edema: Secondary | ICD-10-CM | POA: Insufficient documentation

## 2021-11-27 LAB — COMPREHENSIVE METABOLIC PANEL
ALT: 47 U/L — ABNORMAL HIGH (ref 0–44)
AST: 41 U/L (ref 15–41)
Albumin: 4 g/dL (ref 3.5–5.0)
Alkaline Phosphatase: 102 U/L (ref 38–126)
Anion gap: 10 (ref 5–15)
BUN: 12 mg/dL (ref 6–20)
CO2: 26 mmol/L (ref 22–32)
Calcium: 9.6 mg/dL (ref 8.9–10.3)
Chloride: 97 mmol/L — ABNORMAL LOW (ref 98–111)
Creatinine, Ser: 0.73 mg/dL (ref 0.44–1.00)
GFR, Estimated: >60 mL/min (ref >60)
Glucose, Bld: 246 mg/dL — ABNORMAL HIGH (ref 70–99)
Potassium: 3.7 mmol/L (ref 3.5–5.1)
Sodium: 133 mmol/L — ABNORMAL LOW (ref 135–145)
Total Bilirubin: 0.6 mg/dL (ref 0.3–1.2)
Total Protein: 7.6 g/dL (ref 6.5–8.1)

## 2021-11-27 LAB — C-REACTIVE PROTEIN: CRP: 4.5 mg/dL — ABNORMAL HIGH (ref <1.0)

## 2021-11-27 LAB — CBC WITH DIFFERENTIAL/PLATELET
Abs Immature Granulocytes: 0.05 10*3/uL (ref 0.00–0.07)
Basophils Absolute: 0 10*3/uL (ref 0.0–0.1)
Basophils Relative: 0 %
Eosinophils Absolute: 0.1 10*3/uL (ref 0.0–0.5)
Eosinophils Relative: 1 %
HCT: 38.6 % (ref 36.0–46.0)
Hemoglobin: 13.3 g/dL (ref 12.0–15.0)
Immature Granulocytes: 1 %
Lymphocytes Relative: 22 %
Lymphs Abs: 1.3 10*3/uL (ref 0.7–4.0)
MCH: 29.6 pg (ref 26.0–34.0)
MCHC: 34.5 g/dL (ref 30.0–36.0)
MCV: 85.8 fL (ref 80.0–100.0)
Monocytes Absolute: 0.4 10*3/uL (ref 0.1–1.0)
Monocytes Relative: 7 %
Neutro Abs: 4.1 10*3/uL (ref 1.7–7.7)
Neutrophils Relative %: 69 %
Platelets: 200 10*3/uL (ref 150–400)
RBC: 4.5 MIL/uL (ref 3.87–5.11)
RDW: 12.9 % (ref 11.5–15.5)
WBC: 6 10*3/uL (ref 4.0–10.5)
nRBC: 0 % (ref 0.0–0.2)

## 2021-11-27 LAB — SEDIMENTATION RATE: Sed Rate: 24 mm/hr (ref 0–30)

## 2021-11-27 LAB — PROTIME-INR
INR: 1 (ref 0.8–1.2)
Prothrombin Time: 13 seconds (ref 11.4–15.2)

## 2021-11-27 LAB — BRAIN NATRIURETIC PEPTIDE: B Natriuretic Peptide: 9.2 pg/mL (ref 0.0–100.0)

## 2021-11-27 NOTE — ED Notes (Signed)
Pt called for triage, no answer

## 2021-11-27 NOTE — ED Provider Notes (Signed)
[facility] Provider Note   Event Date/Time   First MD Initiated Contact with Patient 11/27/21 1847     (approximate) History  Leg Swelling  HPI Kahlie D Gambrel is a 51 y.o. female with a stated past medical history of chronic bilateral lower extremity edema who presents for a red rash to the anterior of both shins and bilateral lower extremities.  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  Patient states that the swelling in her legs became severe throughout her shift despite wearing compression stockings and when she got home and taking off the stocking she noticed this redness.  Patient denies any spreading of this redness since last night but does endorse aching, 7/10 pain that radiates behind the right knee on the right leg and does not radiate on the left leg.  Patient does state that this rash is more intense and over a larger area on the right than it is on the left.  Patient denies any symptoms similar to this in the past. ROS: Patient currently denies any vision changes, tinnitus, difficulty speaking, facial droop, sore throat, chest pain, shortness of breath, abdominal pain, nausea/vomiting/diarrhea, dysuria, or weakness/numbness/paresthesias in any extremity   Physical Exam  Triage Vital Signs: ED Triage Vitals  Enc Vitals Group     BP 11/27/21 1824 (!) 131/95     Pulse Rate 11/27/21 1824 86     Resp 11/27/21 1824 18     Temp 11/27/21 1824 98.6 F (37 C)     Temp Source 11/27/21 1824 Oral     SpO2 11/27/21 1824 96 %     Weight 11/27/21 1828 205 lb (93 kg)     Height 11/27/21 1828 '5\' 7"'  (1.702 m)     Head Circumference --      Peak Flow --      Pain Score 11/27/21 1825 7     Pain Loc --      Pain Edu? --      Excl. in [place]? --    Most recent vital signs: Vitals:   11/27/21 2000 11/27/21 2039  BP:  124/89  Pulse:  84  Resp: 17 17  Temp:  98.4 F (36.9 C)  SpO2:  98%   General: Awake,  oriented x4. CV:  Good peripheral perfusion.  Resp:  Normal effort.  Abd:  No distention.  Other:  Middle-aged overweight Caucasian female sitting in bed in no acute distress.  Purpura and petechiae over the anterior shins of bilateral lower extremities without tenderness to palpation ED Results / Procedures / Treatments  Labs (all labs ordered are listed, but only abnormal results are displayed) Labs Reviewed  COMPREHENSIVE METABOLIC PANEL - Abnormal; Notable for the following components:      Result Value   Sodium 133 (*)    Chloride 97 (*)    Glucose, Bld 246 (*)    ALT 47 (*)    All other components within normal limits  CBC WITH DIFFERENTIAL/PLATELET  SEDIMENTATION RATE  BRAIN NATRIURETIC PEPTIDE  PROTIME-INR  C-REACTIVE PROTEIN   PROCEDURES: Critical Care performed: No Procedures MEDICATIONS ORDERED IN ED: Medications - No data to display IMPRESSION / MDM / [place] / ED COURSE  I reviewed the triage vital signs and the nursing notes.  The patient is on the cardiac monitor to evaluate for evidence of arrhythmia and/or significant heart rate changes. Patient's presentation is most consistent with acute presentation with potential threat to life or bodily function. Patient is non-toxic appearing and well hydrated. Ddx: Patients symptoms not typical for other emergent causes of rash such as cellulitis, abscess, necrotizing fasciitis, vasculitis, anaphylaxis, SJS or TENS.  ESR normal, BNP within normal limits, patient's mildly hyponatremic at 133 with elevated glucose of 246.  Patient informed of her hyperglycemia and need for follow-up with her primary care physician. Disposition: Patient will be discharged with strict return precautions and follow up with dermatology within 24-48 hours for further evaluation.   FINAL CLINICAL IMPRESSION(S) / ED DIAGNOSES   Final diagnoses:  Bilateral lower extremity edema  Capillary hemorrhage   Rx  / DC Orders   ED Discharge Orders     None      Note:  This document was prepared using Dragon voice recognition software and may include unintentional dictation errors.   Naaman Plummer, MD 11/27/21 2055

## 2021-11-27 NOTE — ED Provider Triage Note (Signed)
Emergency Medicine Provider Triage Evaluation Note  [name], a 52 y.o. female  was evaluated in triage.  Pt complains of leg swelling and anterior shin rash.  Patient reports today some complaints of edema to the legs as well as some ecchymosis.  Due to the skin changes yesterday patient denies any recent injury, trauma, fall.  Review of Systems  Positive: BLE edema, eechymosis Negative: FCS  Physical Exam  BP (!) 131/95   Pulse 86   Temp 98.6 F (37 C) (Oral)   Resp 18   Ht 5\' 7"  (1.702 m)   Wt 93 kg   SpO2 96%   BMI 32.11 kg/m  Gen:   Awake, no distress   Resp:  Normal effort  MSK:   Moves extremities without difficulty BLE L>R with nonblanchable, macular skin changes to the anterior shin  Other:    Medical Decision Making  Medically screening exam initiated at 6:30 PM.  Appropriate orders placed.  [name] was informed that the remainder of the evaluation will be completed by another provider, this initial triage assessment does not replace that evaluation, and the importance of remaining in the ED until their evaluation is complete.  Patient to the ED for evaluation of bilateral lower extremity edema and skin changes to the anterior shin.   Greggory Stallion, PA-C 11/27/21 1831

## 2021-11-27 NOTE — ED Notes (Signed)
ED Provider at bedside. 

## 2021-11-27 NOTE — ED Triage Notes (Signed)
Patient to ED via POV for leg swelling. Right leg noted to have redness on front of leg with pain behind knee. Left leg noted to have redness as well with toe cramping on this leg. Patient state she did take 2 extra fluid pills today that has helped some.

## 2021-11-27 NOTE — ED Notes (Signed)
Redness noted on anterior BLEs.  Denies pain and/or itching to area.  Pt c/o pain behind R knee and cramping/pain in 2nd-5th L toes.  Denies injury.

## 2022-03-06 ENCOUNTER — Emergency Department
Admission: EM | Admit: 2022-03-06 | Discharge: 2022-03-06 | Disposition: A | Discharge status: Home / Self Care | Payer: PRIVATE HEALTH INSURANCE | Attending: Emergency Medicine | Admitting: Emergency Medicine

## 2022-03-06 ENCOUNTER — Other Ambulatory Visit: Payer: Self-pay

## 2022-03-06 ENCOUNTER — Emergency Department: Payer: PRIVATE HEALTH INSURANCE

## 2022-03-06 DIAGNOSIS — R531 Weakness: Secondary | ICD-10-CM | POA: Insufficient documentation

## 2022-03-06 DIAGNOSIS — E1165 Type 2 diabetes mellitus with hyperglycemia: Secondary | ICD-10-CM | POA: Insufficient documentation

## 2022-03-06 DIAGNOSIS — R197 Diarrhea, unspecified: Secondary | ICD-10-CM | POA: Insufficient documentation

## 2022-03-06 DIAGNOSIS — R0602 Shortness of breath: Secondary | ICD-10-CM | POA: Diagnosis not present

## 2022-03-06 DIAGNOSIS — R079 Chest pain, unspecified: Secondary | ICD-10-CM | POA: Diagnosis present

## 2022-03-06 DIAGNOSIS — Z1152 Encounter for screening for COVID-19: Secondary | ICD-10-CM | POA: Insufficient documentation

## 2022-03-06 LAB — URINALYSIS, ROUTINE W REFLEX MICROSCOPIC
Bilirubin Urine: NEGATIVE
Glucose, UA: 50 mg/dL — AB
Hgb urine dipstick: NEGATIVE
Ketones, ur: NEGATIVE mg/dL
Nitrite: NEGATIVE
Protein, ur: NEGATIVE mg/dL
Specific Gravity, Urine: 1.016 (ref 1.005–1.030)
pH: 5 (ref 5.0–8.0)

## 2022-03-06 LAB — TROPONIN I (HIGH SENSITIVITY)
Troponin I (High Sensitivity): 4 ng/L (ref <18)
Troponin I (High Sensitivity): 2 ng/L (ref <18)

## 2022-03-06 LAB — COMPREHENSIVE METABOLIC PANEL
ALT: 30 U/L (ref 0–44)
AST: 32 U/L (ref 15–41)
Albumin: 4.3 g/dL (ref 3.5–5.0)
Alkaline Phosphatase: 76 U/L (ref 38–126)
Anion gap: 15 (ref 5–15)
BUN: 15 mg/dL (ref 6–20)
CO2: 23 mmol/L (ref 22–32)
Calcium: 9.1 mg/dL (ref 8.9–10.3)
Chloride: 98 mmol/L (ref 98–111)
Creatinine, Ser: 1.11 mg/dL — ABNORMAL HIGH (ref 0.44–1.00)
GFR, Estimated: >60 mL/min (ref >60)
Glucose, Bld: 261 mg/dL — ABNORMAL HIGH (ref 70–99)
Potassium: 4.1 mmol/L (ref 3.5–5.1)
Sodium: 136 mmol/L (ref 135–145)
Total Bilirubin: 0.8 mg/dL (ref 0.3–1.2)
Total Protein: 7.6 g/dL (ref 6.5–8.1)

## 2022-03-06 LAB — C DIFFICILE QUICK SCREEN W PCR REFLEX
C Diff antigen: NEGATIVE
C Diff interpretation: NOT DETECTED
C Diff toxin: NEGATIVE

## 2022-03-06 LAB — CBC
HCT: 38.3 % (ref 36.0–46.0)
Hemoglobin: 12.9 g/dL (ref 12.0–15.0)
MCH: 29.7 pg (ref 26.0–34.0)
MCHC: 33.7 g/dL (ref 30.0–36.0)
MCV: 88.2 fL (ref 80.0–100.0)
Platelets: 168 10*3/uL (ref 150–400)
RBC: 4.34 MIL/uL (ref 3.87–5.11)
RDW: 13.3 % (ref 11.5–15.5)
WBC: 6.9 10*3/uL (ref 4.0–10.5)
nRBC: 0 % (ref 0.0–0.2)

## 2022-03-06 LAB — RESP PANEL BY RT-PCR (FLU A&B, COVID) ARPGX2
Influenza A by PCR: NEGATIVE
Influenza B by PCR: NEGATIVE
SARS Coronavirus 2 by RT PCR: NEGATIVE

## 2022-03-06 LAB — LIPASE, BLOOD: Lipase: 29 U/L (ref 11–51)

## 2022-03-06 MED ORDER — SODIUM CHLORIDE 0.9 % IV BOLUS
1000.0000 mL | Freq: Once | INTRAVENOUS | Status: AC
  Administered 2022-03-06: 1000 mL via INTRAVENOUS

## 2022-03-06 NOTE — ED Provider Notes (Signed)
[facility] Provider Note    Event Date/Time   First MD Initiated Contact with Patient 03/06/22 1322     (approximate)   History   Chest Pain and Shortness of Breath   HPI  [name] is a 51 y.o. female with past medical history significant for diabetes, hyperlipidemia, who presents to the emergency department with chest pain and generalized weakness and fatigue.  States that last night she was feeling slightly more tired than normal.  When she got up this morning she was feeling fatigued but went to go to a field trip with her granddaughter to [place].  States that while she was walking around [place] developed chest pain and felt like she might pass out.  States that she broke out into a sweat and got nauseous.  No shortness of breath.  Continues to have some generalized weakness and not feeling 100%.  Does have family history of coronary artery disease in her dad at a young age.  No prior history of DVT or PE.  Denies dysuria, urinary urgency or frequency.  Patient states that she has been having ongoing diarrhea that has been watery over the past 1 month.  States that it occurred when she started metformin.  No recent antibiotic use.      Physical Exam   Triage Vital Signs: ED Triage Vitals  Enc Vitals Group     BP 03/06/22 1251 (!) 94/58     Pulse Rate 03/06/22 1251 95     Resp 03/06/22 1251 18     Temp 03/06/22 1251 98.4 F (36.9 C)     Temp Source 03/06/22 1251 Oral     SpO2 03/06/22 1251 94 %     Weight 03/06/22 1254 206 lb (93.4 kg)     Height 03/06/22 1254 5\' 8"  (1.727 m)     Head Circumference --      Peak Flow --      Pain Score 03/06/22 1254 5     Pain Loc --      Pain Edu? --      Excl. in [place]? --     Most recent vital signs: Vitals:   03/06/22 1251 03/06/22 1400  BP: (!) 94/58 91/62  Pulse: 95   Resp: 18   Temp: 98.4 F (36.9 C)   SpO2: 94%     Physical Exam Constitutional:      Appearance: She is well-developed.   HENT:     Head: Atraumatic.  Eyes:     Conjunctiva/sclera: Conjunctivae normal.  Cardiovascular:     Rate and Rhythm: Regular rhythm.  Pulmonary:     Effort: No respiratory distress.  Abdominal:     General: There is no distension.  Musculoskeletal:        General: Normal range of motion.     Cervical back: Normal range of motion.  Skin:    General: Skin is warm.  Neurological:     Mental Status: She is alert. Mental status is at baseline.          IMPRESSION / MDM / ASSESSMENT AND PLAN / ED COURSE  I reviewed the triage vital signs and the nursing notes.  Differential diagnosis including ACS, pulmonary embolism, pneumonia, dehydration, electrolyte abnormality, COVID, urinary tract infection, C. difficile, medication side effect  Ordered a C. difficile but have a low suspicion given that she has not been on any recent antibiotic use and more likely in the setting of metformin use.  EKG  My interpretation of the EKG -normal sinus rhythm.  Normal intervals.  No chamber enlargement.  No significant ST elevation or depression.  No signs of acute ischemia or dysrhythmia.  No tachycardic or bradycardic dysrhythmias while on cardiac telemetry.  RADIOLOGY I independently reviewed imaging, my interpretation of imaging: Chest x-ray shows no signs of pneumonia.    ED Results / Procedures / Treatments   Labs (all labs ordered are listed, but only abnormal results are displayed) Labs interpreted as -  Patient given 1 L of IV fluids.  Creatinine appears to be at her baseline.  No significant electrolyte abnormalities.  Does have hyperglycemia.  COVID and influenza testing is negative.  Troponin negative doubt ACS.  Low risk heart score.  Labs Reviewed  COMPREHENSIVE METABOLIC PANEL - Abnormal; Notable for the following components:      Result Value   Glucose, Bld 261 (*)    Creatinine, Ser 1.11 (*)    All other components within normal limits  C DIFFICILE QUICK SCREEN W  PCR REFLEX    RESP PANEL BY RT-PCR (FLU A&B, COVID) ARPGX2  CBC  LIPASE, BLOOD  URINALYSIS, ROUTINE W REFLEX MICROSCOPIC  TROPONIN I (HIGH SENSITIVITY)  TROPONIN I (HIGH SENSITIVITY)   Plan for reevaluation.  On repeat blood pressure checked patient continues to have low blood pressure.  Likely in the setting of dehydration from her chronic diarrhea.  Plan for reevaluation after UA and fluids.  Care transferred to Dr. Cheri Fowler    PROCEDURES:  Critical Care performed: No  Procedures  Patient's presentation is most consistent with acute presentation with potential threat to life or bodily function.   MEDICATIONS ORDERED IN ED: Medications  sodium chloride 0.9 % bolus 1,000 mL (has no administration in time range)    FINAL CLINICAL IMPRESSION(S) / ED DIAGNOSES   Final diagnoses:  Chest pain, unspecified type  Weakness  Diarrhea, unspecified type     Rx / DC Orders   ED Discharge Orders     None        Note:  This document was prepared using Dragon voice recognition software and may include unintentional dictation errors.   Nathaniel Man, MD 03/06/22 [phone]

## 2022-03-06 NOTE — ED Notes (Signed)
ED Provider at bedside. 

## 2022-03-06 NOTE — ED Triage Notes (Signed)
See First Nurse note.  Pt c/o central chest pain radiating into back, SOB, and generalized muscle cramps while walking this morning.  Pt reports diarrhea x "a couple weeks."  Pain score 5/10.    Pt ambulatory and able to speak full sentences.

## 2022-03-06 NOTE — ED Triage Notes (Signed)
Arrives of c/o chest pain and SOB today.  Sates symptoms started while walking with [place]daughter [place].  C/O chest and back pain (between scapula) requiring her to sit down.  Continues to c/o apin.  AAOx3.  Skin warm and dry. NAD

## 2022-03-06 NOTE — ED Notes (Addendum)
Orthostatic VS Laying : HR 88, BP 103/67 Sitting: HR 88, BP 98/61 Standing: HR 92, BP 94/67  Pt. States she feels slightly dizzy when going from sitting to standing. Dr. Jori Moll notified of findings.

## 2022-06-12 ENCOUNTER — Ambulatory Visit
Admission: EM | Admit: 2022-06-12 | Discharge: 2022-06-12 | Disposition: A | Discharge status: Home / Self Care | Payer: PRIVATE HEALTH INSURANCE

## 2022-06-12 DIAGNOSIS — J0100 Acute maxillary sinusitis, unspecified: Secondary | ICD-10-CM

## 2022-06-12 MED ORDER — AMOXICILLIN 875 MG PO TABS: 875.0000 mg | ORAL_TABLET | Freq: Two times a day (BID) | ORAL | 0 refills | Status: AC

## 2022-06-12 NOTE — Discharge Instructions (Addendum)
Take the amoxicillin as directed.  Follow up with your primary care provider if your symptoms are not improving.   ° ° °

## 2022-06-12 NOTE — ED Provider Notes (Signed)
[name]    CSN: [id number] Arrival date & time: 06/12/22  1218      History   Chief Complaint Chief Complaint  Patient presents with   Cough   Nasal Congestion   Headache    HPI [name] is a 52 y.o. female.  Patient presents with 7 day history of congestion, sinus pressure, headache, cough.  Treatment attempted with sinus and cough medication.  No fever, ear pain, sore throat, chest pain, shortness of breath, vomiting, diarrhea, or other symptoms.  Her medical history includes diabetes, clotting disorder, ovarian cyst, anxiety depression.    The history is provided by the patient and medical records.    Past Medical History:  Diagnosis Date   Anxiety    Blood dyscrasia    hx of clotting disorder   Clotting disorder (HCC)    Depression    Diabetes mellitus without complication (HCC)    Ovarian cyst    S/P laparoscopic cholecystectomy     Patient Active Problem List   Diagnosis Date Noted   Diarrhea 08/12/2011   Nausea and vomiting 08/04/2011   Abdominal pain of unknown etiology 08/04/2011   Hypokalemia 08/04/2011   Dehydration 08/04/2011   Ovarian cystic mass 08/04/2011   Hyponatremia 08/04/2011   Back pain 08/04/2011   Scoliosis 08/04/2011    Past Surgical History:  Procedure Laterality Date   ABDOMINAL HYSTERECTOMY     CEASAREAN     CHOLECYSTECTOMY     TONSILLECTOMY      OB History   No obstetric history on file.      Home Medications    Prior to Admission medications   Medication Sig Start Date End Date Taking? Authorizing Provider  amoxicillin (AMOXIL) 875 MG tablet Take 1 tablet (875 mg total) by mouth 2 (two) times daily for 10 days. 06/12/22 06/22/22 Yes Mickie Bail, NP  lisinopril (ZESTRIL) 20 MG tablet Take 20 mg by mouth daily. 05/02/22  Yes [provider]  metFORMIN (GLUCOPHAGE) 500 MG tablet Take 500 mg by mouth 2 (two) times daily. 06/04/22  Yes [provider]  NOVOLOG FLEXPEN 100 UNIT/ML FlexPen  Inject into the skin as directed. 05/02/22  Yes [provider]  albuterol (PROVENTIL HFA;VENTOLIN HFA) 108 (90 BASE) MCG/ACT inhaler Inhale 2 puffs into the lungs every 6 (six) hours as needed for wheezing or shortness of breath. 02/26/15   Phineas Semen, MD  albuterol (VENTOLIN HFA) 108 (90 Base) MCG/ACT inhaler Inhale 2 puffs into the lungs every 6 (six) hours as needed for wheezing or shortness of breath. 08/29/18   Arnaldo Natal, MD  ALPRAZolam Prudy Feeler) 0.25 MG tablet Take 0.25 mg by mouth 2 (two) times daily as needed. For anxiety    [provider]  atorvastatin (LIPITOR) 40 MG tablet Take 40 mg by mouth daily. 08/06/18   [provider]  Cholecalciferol (VITAMIN D3) 1.25 MG (50000 UT) CAPS Take 50,000 Units by mouth once a week. 08/06/18   [provider]  citalopram (CELEXA) 40 MG tablet Take 40 mg by mouth daily. 08/15/18   [provider]  cyclobenzaprine (FLEXERIL) 10 MG tablet Take 10 mg by mouth 3 (three) times daily.    [provider]  dicyclomine (BENTYL) 20 MG tablet Take 1 tablet (20 mg total) by mouth 2 (two) times daily. 08/23/20   Arthor Captain, PA-C  diphenhydrAMINE (SOMINEX) 25 MG tablet Take 25 mg by mouth daily as needed. For sleep    [provider]  furosemide (LASIX) 40 MG tablet Take 40 mg by mouth 2 (two) times daily.    [provider]  HYDROcodone-acetaminophen (NORCO/VICODIN) 5-325 MG per tablet Take 1 tablet by mouth 2 (two) times daily as needed. For lower quadrant pain    [provider]  ibuprofen (ADVIL) 600 MG tablet Take 1 tablet (600 mg total) by mouth every 6 (six) hours as needed. 06/17/21   Couture, Cortni S, PA-C  ibuprofen (ADVIL,MOTRIN) 800 MG tablet Take 1 tablet (800 mg total) by mouth every 8 (eight) hours as needed for moderate pain. Patient not taking: Reported on 08/29/2018 12/08/15   Paulette Blanch, MD  Melatonin 5 MG TABS Take 1 tablet by mouth.    [provider]   meloxicam (MOBIC) 15 MG tablet Take 15 mg by mouth daily.    [provider]  metoCLOPramide (REGLAN) 5 MG tablet Take 1 tablet (5 mg total) by mouth every 8 (eight) hours as needed. 08/14/11 08/24/11  Dellinger, Bobby Rumpf, PA-C  omeprazole (PRILOSEC) 40 MG capsule Take 40 mg by mouth daily.    [provider]  ondansetron (ZOFRAN) 4 MG tablet Take 1 tablet (4 mg total) by mouth every 8 (eight) hours as needed for nausea or vomiting. 08/23/20   Margarita Mail, PA-C  ondansetron (ZOFRAN) 4 MG tablet Take 1 tablet (4 mg total) by mouth every 6 (six) hours. 06/17/21   Couture, Cortni S, PA-C  oxycodone (OXY-IR) 5 MG capsule Take 5 mg by mouth every 6 (six) hours as needed. For lower quadrant pain    [provider]  oxyCODONE-acetaminophen (PERCOCET/ROXICET) 5-325 MG tablet Take 1 tablet by mouth every 4 (four) hours as needed for severe pain. 08/31/21   Paulette Blanch, MD  potassium chloride (KLOR-CON) 10 MEQ tablet Take 20 mEq by mouth daily.    [provider]  QUEtiapine (SEROQUEL) 100 MG tablet Take 100 mg by mouth at bedtime.    [provider]  sulfamethoxazole-trimethoprim (BACTRIM DS) 800-160 MG tablet Take 1 tablet by mouth 2 (two) times daily. 10/29/21   Scot Jun, NP  TOUJEO MAX SOLOSTAR 300 UNIT/ML Solostar Pen SMARTSIG:20 Unit(s) SUB-Q Twice Daily    [provider]  triamcinolone (KENALOG) 0.025 % cream Apply 1 Application topically 3 (three) times daily as needed. For rash on legs. 10/29/21   Scot Jun, NP    Family History Family History  Problem Relation Age of Onset   Breast cancer Mother 8    Social History Social History   Tobacco Use   Smoking status: Former    Packs/day: 0.00    Years: 20.00    Total pack years: 0.00    Types: Cigarettes   Smokeless tobacco: Never  Vaping Use   Vaping Use: Every day  Substance Use Topics   Alcohol use: Yes    Comment: social   Drug use: No     Allergies    Promethazine hcl   Review of Systems Review of Systems  Constitutional:  Negative for chills and fever.  HENT:  Positive for congestion, postnasal drip, rhinorrhea and sinus pressure. Negative for ear pain and sore throat.   Respiratory:  Positive for cough. Negative for shortness of breath.   Cardiovascular:  Negative for chest pain and palpitations.  Gastrointestinal:  Negative for diarrhea and vomiting.  Skin:  Negative for rash.  Neurological:  Positive for headaches.  All other systems reviewed and are negative.    Physical Exam Triage Vital Signs  ED Triage Vitals  Enc Vitals Group     BP      Pulse      Resp      Temp      Temp src      SpO2      Weight      Height      Head Circumference      Peak Flow      Pain Score      Pain Loc      Pain Edu?      Excl. in [place]?    No data found.  Updated Vital Signs BP 122/87   Pulse 87   Temp 99 F (37.2 C)   Resp 18   SpO2 96%   Visual Acuity Right Eye Distance:   Left Eye Distance:   Bilateral Distance:    Right Eye Near:   Left Eye Near:    Bilateral Near:     Physical Exam Vitals and nursing note reviewed.  Constitutional:      General: She is not in acute distress.    Appearance: Normal appearance. She is well-developed. She is not ill-appearing.  HENT:     Right Ear: Tympanic membrane normal.     Left Ear: Tympanic membrane normal.     Nose: Congestion present.     Mouth/Throat:     Mouth: Mucous membranes are moist.     Pharynx: Oropharynx is clear.  Cardiovascular:     Rate and Rhythm: Normal rate and regular rhythm.     Heart sounds: Normal heart sounds.  Pulmonary:     Effort: Pulmonary effort is normal. No respiratory distress.     Breath sounds: Normal breath sounds. No wheezing, rhonchi or rales.  Musculoskeletal:     Cervical back: Neck supple.  Skin:    General: Skin is warm and dry.  Neurological:     Mental Status: She is alert.  Psychiatric:        Mood and Affect: Mood  normal.        Behavior: Behavior normal.      UC Treatments / Results  Labs (all labs ordered are listed, but only abnormal results are displayed) Labs Reviewed - No data to display  EKG   Radiology No results found.  Procedures Procedures (including critical care time)  Medications Ordered in UC Medications - No data to display  Initial Impression / Assessment and Plan / UC Course  I have reviewed the triage vital signs and the nursing notes.  Pertinent labs & imaging results that were available during my care of the patient were reviewed by me and considered in my medical decision making (see chart for details).    Acute sinusitis.  Treating with amoxicillin.  Education provided on sinus infection.  Instructed patient to follow up with her PCP if her symptoms are not improving.  She agrees to plan of care.    Final Clinical Impressions(s) / UC Diagnoses   Final diagnoses:  Acute non-recurrent maxillary sinusitis     Discharge Instructions      Take the amoxicillin as directed.  Follow up with your primary care provider if your symptoms are not improving.        ED Prescriptions     Medication Sig Dispense Auth. Provider   amoxicillin (AMOXIL) 875 MG tablet Take 1 tablet (875 mg total) by mouth 2 (two) times daily for 10 days. 20 tablet Sharion Balloon, NP      I  have reviewed the PDMP during this encounter.   Sharion Balloon, NP 06/12/22 [phone]

## 2022-06-12 NOTE — ED Triage Notes (Signed)
Patient to Urgent Care with complaints of sinus pain/ pressure. Pounding headache. Worsening dry and hacking cough, productive with discolored sputum in the morning. Has been sleeping in a recliner due to cough and SHOB.  Symptoms started last Thursday.   Has been taking tylenol sinus, and mucinex night shift cough syrup. Benzonatate.

## 2022-06-25 ENCOUNTER — Other Ambulatory Visit: Payer: Self-pay | Admitting: Family Medicine

## 2022-06-25 DIAGNOSIS — Z1231 Encounter for screening mammogram for malignant neoplasm of breast: Secondary | ICD-10-CM

## 2022-07-17 ENCOUNTER — Ambulatory Visit
Admission: RE | Admit: 2022-07-17 | Discharge: 2022-07-17 | Disposition: A | Discharge status: Home / Self Care | Payer: PRIVATE HEALTH INSURANCE | Source: Ambulatory Visit | Attending: Family Medicine | Admitting: Family Medicine

## 2022-07-17 DIAGNOSIS — Z1231 Encounter for screening mammogram for malignant neoplasm of breast: Secondary | ICD-10-CM | POA: Insufficient documentation

## 2023-05-15 ENCOUNTER — Emergency Department: Payer: Self-pay

## 2023-05-15 ENCOUNTER — Encounter: Payer: Self-pay | Admitting: Emergency Medicine

## 2023-05-15 ENCOUNTER — Other Ambulatory Visit: Payer: Self-pay

## 2023-05-15 ENCOUNTER — Emergency Department
Admission: EM | Admit: 2023-05-15 | Discharge: 2023-05-15 | Disposition: A | Discharge status: Home / Self Care | Payer: Self-pay | Attending: Emergency Medicine | Admitting: Emergency Medicine

## 2023-05-15 DIAGNOSIS — M5412 Radiculopathy, cervical region: Secondary | ICD-10-CM | POA: Insufficient documentation

## 2023-05-15 DIAGNOSIS — M25512 Pain in left shoulder: Secondary | ICD-10-CM | POA: Insufficient documentation

## 2023-05-15 MED ORDER — ACETAMINOPHEN 325 MG PO TABS
650.0000 mg | ORAL_TABLET | Freq: Once | ORAL | Status: AC
  Administered 2023-05-15: 650 mg via ORAL
  Filled 2023-05-15: qty 2

## 2023-05-15 MED ORDER — KETOROLAC TROMETHAMINE 15 MG/ML IJ SOLN
15.0000 mg | Freq: Once | INTRAMUSCULAR | Status: AC
  Administered 2023-05-15: 15 mg via INTRAMUSCULAR
  Filled 2023-05-15: qty 1

## 2023-05-15 MED ORDER — PREDNISONE 10 MG (21) PO TBPK: ORAL_TABLET | ORAL | 0 refills | Status: AC

## 2023-05-15 NOTE — Discharge Instructions (Signed)
 Your CT scan shows foraminal narrowing at C3-C4 and C5-C6.  Please take the medication as prescribed, and also follow-up with the spine surgeon for further management.  Please return for any new, worsening, or change in symptoms or other concerns including weakness in your arm or decreased grip strength.  No was a pleasure caring for you today.

## 2023-05-15 NOTE — ED Triage Notes (Signed)
 Patient to ED via POV for left shoulder pain. Pain radiates down left arm and upper back. Denies injury. Hurts worse when turning neck to the left.

## 2023-05-15 NOTE — ED Provider Notes (Signed)
 [facility] Provider Note    Event Date/Time   First MD Initiated Contact with Patient 05/15/23 1107     (approximate)   History   Shoulder Pain   HPI  [name] is a 53 y.o. female who presents today for evaluation of left-sided shoulder pain for the past 2 days.  Patient reports that her pain begins in her neck and radiates down her arm into her fourth and fifth fingers.  She denies weakness in her arm or paresthesias.  She reports that her pain is worse when she turns her head towards the left.  No chest pain or shortness of breath.  No nausea, diaphoresis, or vomiting.  She denies any injuries.  Patient Active Problem List   Diagnosis Date Noted   Diarrhea 08/12/2011   Nausea and vomiting 08/04/2011   Abdominal pain of unknown etiology 08/04/2011   Hypokalemia 08/04/2011   Dehydration 08/04/2011   Ovarian cystic mass 08/04/2011   Hyponatremia 08/04/2011   Back pain 08/04/2011   Scoliosis 08/04/2011          Physical Exam   Triage Vital Signs: ED Triage Vitals [05/15/23 1008]  Encounter Vitals Group     BP 138/86     Systolic BP Percentile      Diastolic BP Percentile      Pulse Rate 85     Resp 18     Temp 98.4 F (36.9 C)     Temp Source Oral     SpO2 95 %     Weight 203 lb (92.1 kg)     Height 5' 8 (1.727 m)     Head Circumference      Peak Flow      Pain Score 7     Pain Loc      Pain Education      Exclude from Growth Chart     Most recent vital signs: Vitals:   05/15/23 1008  BP: 138/86  Pulse: 85  Resp: 18  Temp: 98.4 F (36.9 C)  SpO2: 95%    Physical Exam Vitals and nursing note reviewed.  Constitutional:      General: Awake and alert. No acute distress.    Appearance: Normal appearance. The patient is obese.  HENT:     Head: Normocephalic and atraumatic.     Mouth: Mucous membranes are moist.  Eyes:     General: PERRL. Normal EOMs        Right eye: No discharge.        Left eye: No discharge.      Conjunctiva/sclera: Conjunctivae normal.  Cardiovascular:     Rate and Rhythm: Normal rate and regular rhythm.     Pulses: Normal pulses.  Pulmonary:     Effort: Pulmonary effort is normal. No respiratory distress.     Breath sounds: Normal breath sounds.  Abdominal:     Abdomen is soft. There is no abdominal tenderness. No rebound or guarding. No distention. Musculoskeletal:        General: No swelling. Normal range of motion.     Cervical back: Normal range of motion and neck supple. No midline cervical spine tenderness.  Full range of motion of neck.  Positive Spurling test.  Positive distraction test.  Negative Lhermitte sign.  Normal strength and sensation in bilateral upper extremities. Normal grip strength bilaterally.  Normal intrinsic muscle function of the hand bilaterally.  Normal radial pulses bilaterally. Full and normal range of motion of left shoulder  Skin:    General: Skin is warm and dry.     Capillary Refill: Capillary refill takes less than 2 seconds.     Findings: No rash.  Neurological:     Mental Status: The patient is awake and alert.      ED Results / Procedures / Treatments   Labs (all labs ordered are listed, but only abnormal results are displayed) Labs Reviewed - No data to display   EKG     RADIOLOGY I independently reviewed and interpreted imaging and agree with radiologists findings.     PROCEDURES:  Critical Care performed:   Procedures   MEDICATIONS ORDERED IN ED: Medications  ketorolac  (TORADOL ) 15 MG/ML injection 15 mg (15 mg Intramuscular Given 05/15/23 1140)  acetaminophen  (TYLENOL ) tablet 650 mg (650 mg Oral Given 05/15/23 1139)     IMPRESSION / MDM / ASSESSMENT AND PLAN / ED COURSE  I reviewed the triage vital signs and the nursing notes.   Differential diagnosis includes, but is not limited to, cervical radiculopathy, rotator cuff injury, shoulder injury.  Patient is awake and alert, hemodynamically stable and afebrile.   She is nontoxic in appearance.  She has full and normal range of motion of her shoulder.  I do not suspect a primary shoulder injury.  X-ray of her shoulder was obtained in triage.  Patient has reproduction of pain with Spurling test, with relief of pain with cervical distraction test consistent with cervical radiculopathy.  She has no infectious symptoms or history of IV drug use or fever to suggest epidural abscess.  She is not anticoagulated, no trauma, do not suspect epidural hematoma.  She has normal strength and sensation throughout her arm, normal grip strength bilaterally, not consistent with central cord syndrome.  Patient was treated symptomatically with Toradol  and Tylenol  while awaiting CT scan.  CT does reveal foraminal narrowing at C3-C4 and C5-C6 consistent with her physical exam.  She was started on a prednisone  Dosepak.  She does have a history of diabetes and we discussed that this medication may cause major fluctuations in her blood sugar and she was advised to pay very close attention to her blood sugar which she agrees to do.  She was given the information for neurosurgery for further management.  We discussed return precautions in the meantime.  Patient understands and agrees with plan.  She was discharged in stable condition.   Patient's presentation is most consistent with acute complicated illness / injury requiring diagnostic workup.    FINAL CLINICAL IMPRESSION(S) / ED DIAGNOSES   Final diagnoses:  Cervical radiculopathy     Rx / DC Orders   ED Discharge Orders          Ordered    predniSONE  (STERAPRED UNI-PAK 21 TAB) 10 MG (21) TBPK tablet        05/15/23 1356             Note:  This document was prepared using Dragon voice recognition software and may include unintentional dictation errors.   Bernetta Sutley E, PA-C 05/15/23 1704    Levander Slate, MD 05/16/23 [phone]

## 2023-05-15 NOTE — ED Notes (Signed)
 See triage notes. Patient c/o left shoulder/neck pain times two days. Patient denies any recent injury.

## 2023-08-29 ENCOUNTER — Other Ambulatory Visit: Payer: Self-pay | Admitting: Family Medicine

## 2023-08-29 DIAGNOSIS — Z1231 Encounter for screening mammogram for malignant neoplasm of breast: Secondary | ICD-10-CM

## 2023-09-08 ENCOUNTER — Ambulatory Visit
Admission: RE | Admit: 2023-09-08 | Discharge: 2023-09-08 | Disposition: A | Discharge status: Home / Self Care | Payer: Self-pay | Source: Ambulatory Visit | Attending: Family Medicine | Admitting: Family Medicine

## 2023-09-08 DIAGNOSIS — Z1231 Encounter for screening mammogram for malignant neoplasm of breast: Secondary | ICD-10-CM | POA: Diagnosis present
